# Patient Record
Sex: Female | Born: 1970 | Race: White | Hispanic: No | Marital: Married | Smoking: Never smoker
Health system: Southern US, Community
[De-identification: ages and names within clinical notes are randomized; demographics above are authoritative.]

## PROBLEM LIST (undated history)

## (undated) DIAGNOSIS — Z8659 Personal history of other mental and behavioral disorders: Secondary | ICD-10-CM

## (undated) DIAGNOSIS — N6019 Diffuse cystic mastopathy of unspecified breast: Secondary | ICD-10-CM

## (undated) HISTORY — PX: NO PAST SURGERIES: SHX2092

## (undated) HISTORY — DX: Diffuse cystic mastopathy of unspecified breast: N60.19

## (undated) HISTORY — DX: Personal history of other mental and behavioral disorders: Z86.59

---

## 2005-06-04 ENCOUNTER — Ambulatory Visit (HOSPITAL_COMMUNITY): Admission: RE | Admit: 2005-06-04 | Discharge: 2005-06-04 | Payer: Self-pay | Admitting: Gynecology

## 2005-06-04 ENCOUNTER — Encounter (INDEPENDENT_AMBULATORY_CARE_PROVIDER_SITE_OTHER): Payer: Self-pay | Admitting: Specialist

## 2006-03-18 ENCOUNTER — Ambulatory Visit: Payer: Self-pay | Admitting: Family Medicine

## 2006-03-27 ENCOUNTER — Encounter: Admission: RE | Admit: 2006-03-27 | Discharge: 2006-03-27 | Payer: Self-pay | Admitting: Family Medicine

## 2006-09-11 ENCOUNTER — Other Ambulatory Visit: Admission: RE | Admit: 2006-09-11 | Discharge: 2006-09-11 | Payer: Self-pay | Admitting: Gynecology

## 2006-11-21 ENCOUNTER — Ambulatory Visit: Payer: Self-pay | Admitting: Gynecology

## 2006-12-01 ENCOUNTER — Encounter (INDEPENDENT_AMBULATORY_CARE_PROVIDER_SITE_OTHER): Payer: Self-pay | Admitting: Gynecology

## 2006-12-01 ENCOUNTER — Ambulatory Visit: Payer: Self-pay | Admitting: Gynecology

## 2006-12-16 ENCOUNTER — Ambulatory Visit (HOSPITAL_COMMUNITY): Admission: RE | Admit: 2006-12-16 | Discharge: 2006-12-16 | Payer: Self-pay | Admitting: Gynecology

## 2006-12-29 ENCOUNTER — Ambulatory Visit: Payer: Self-pay | Admitting: Gynecology

## 2007-01-13 ENCOUNTER — Ambulatory Visit (HOSPITAL_COMMUNITY): Admission: RE | Admit: 2007-01-13 | Discharge: 2007-01-13 | Payer: Self-pay | Admitting: Gynecology

## 2007-01-27 ENCOUNTER — Ambulatory Visit: Payer: Self-pay | Admitting: Family Medicine

## 2007-01-27 ENCOUNTER — Ambulatory Visit (HOSPITAL_COMMUNITY): Admission: RE | Admit: 2007-01-27 | Discharge: 2007-01-27 | Payer: Self-pay | Admitting: Gynecology

## 2007-02-26 ENCOUNTER — Ambulatory Visit: Payer: Self-pay | Admitting: Obstetrics & Gynecology

## 2007-03-25 ENCOUNTER — Ambulatory Visit: Payer: Self-pay | Admitting: Obstetrics & Gynecology

## 2007-04-13 ENCOUNTER — Ambulatory Visit: Payer: Self-pay | Admitting: Gynecology

## 2007-04-27 ENCOUNTER — Ambulatory Visit: Payer: Self-pay | Admitting: Gynecology

## 2007-05-11 ENCOUNTER — Ambulatory Visit: Payer: Self-pay | Admitting: Gynecology

## 2007-05-25 ENCOUNTER — Ambulatory Visit: Payer: Self-pay | Admitting: Gynecology

## 2007-06-01 ENCOUNTER — Ambulatory Visit: Payer: Self-pay | Admitting: Gynecology

## 2007-06-08 ENCOUNTER — Ambulatory Visit: Payer: Self-pay | Admitting: Gynecology

## 2007-06-16 ENCOUNTER — Ambulatory Visit: Payer: Self-pay | Admitting: Family Medicine

## 2007-06-22 ENCOUNTER — Ambulatory Visit: Payer: Self-pay | Admitting: Obstetrics & Gynecology

## 2007-06-25 ENCOUNTER — Inpatient Hospital Stay (HOSPITAL_COMMUNITY): Admission: AD | Admit: 2007-06-25 | Discharge: 2007-06-27 | Payer: Self-pay | Admitting: Gynecology

## 2007-06-25 ENCOUNTER — Ambulatory Visit: Payer: Self-pay | Admitting: Physician Assistant

## 2007-07-08 ENCOUNTER — Ambulatory Visit: Payer: Self-pay | Admitting: Gynecology

## 2007-08-10 ENCOUNTER — Ambulatory Visit: Payer: Self-pay | Admitting: Gynecology

## 2007-08-19 ENCOUNTER — Ambulatory Visit: Payer: Self-pay | Admitting: Family Medicine

## 2007-11-24 ENCOUNTER — Ambulatory Visit: Payer: Self-pay | Admitting: Family Medicine

## 2007-11-24 ENCOUNTER — Encounter: Payer: Self-pay | Admitting: Family Medicine

## 2008-02-24 ENCOUNTER — Ambulatory Visit: Payer: Self-pay | Admitting: Family Medicine

## 2008-09-27 ENCOUNTER — Ambulatory Visit: Payer: Self-pay | Admitting: Nurse Practitioner

## 2008-11-14 LAB — CONVERTED CEMR LAB: Pap Smear: NORMAL

## 2008-11-17 ENCOUNTER — Encounter: Payer: Self-pay | Admitting: Family Medicine

## 2008-11-17 ENCOUNTER — Ambulatory Visit: Payer: Self-pay | Admitting: Obstetrics & Gynecology

## 2008-12-08 ENCOUNTER — Encounter: Payer: Self-pay | Admitting: Family Medicine

## 2008-12-08 ENCOUNTER — Ambulatory Visit: Payer: Self-pay | Admitting: Family Medicine

## 2009-03-31 ENCOUNTER — Ambulatory Visit: Payer: Self-pay | Admitting: Family Medicine

## 2009-03-31 DIAGNOSIS — M545 Low back pain: Secondary | ICD-10-CM

## 2009-03-31 DIAGNOSIS — J309 Allergic rhinitis, unspecified: Secondary | ICD-10-CM | POA: Insufficient documentation

## 2009-03-31 DIAGNOSIS — Z87898 Personal history of other specified conditions: Secondary | ICD-10-CM | POA: Insufficient documentation

## 2009-03-31 DIAGNOSIS — G43109 Migraine with aura, not intractable, without status migrainosus: Secondary | ICD-10-CM

## 2009-03-31 DIAGNOSIS — E039 Hypothyroidism, unspecified: Secondary | ICD-10-CM

## 2009-03-31 DIAGNOSIS — Z8659 Personal history of other mental and behavioral disorders: Secondary | ICD-10-CM

## 2009-04-07 ENCOUNTER — Ambulatory Visit: Payer: Self-pay | Admitting: Family Medicine

## 2009-04-12 LAB — CONVERTED CEMR LAB
ALT: 25 units/L (ref 0–35)
AST: 28 units/L (ref 0–37)
Albumin: 3.9 g/dL (ref 3.5–5.2)
BUN: 16 mg/dL (ref 6–23)
Calcium: 9 mg/dL (ref 8.4–10.5)
Creatinine, Ser: 0.7 mg/dL (ref 0.4–1.2)
Glucose, Bld: 83 mg/dL (ref 70–99)
Potassium: 4.1 meq/L (ref 3.5–5.1)
Total CHOL/HDL Ratio: 3
Total Protein: 6.8 g/dL (ref 6.0–8.3)
Triglycerides: 42 mg/dL (ref 0.0–149.0)
VLDL: 8.4 mg/dL (ref 0.0–40.0)

## 2009-05-10 ENCOUNTER — Ambulatory Visit: Payer: Self-pay | Admitting: Obstetrics & Gynecology

## 2009-07-06 ENCOUNTER — Encounter: Payer: Self-pay | Admitting: Family Medicine

## 2009-09-28 IMAGING — US ULTRASOUND LEFT BREAST
1 series · 14 of 14 positions shown · non-contrast
Comparison: none

REASON FOR EXAM: left breast nodule
COMMENTS:

[Series 1: ultrasound left breast · 14 of 14 slices shown]
[im 1/14]
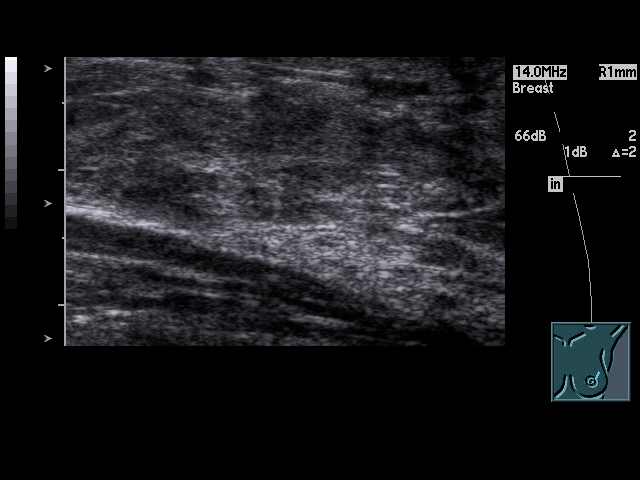
[im 2/14]
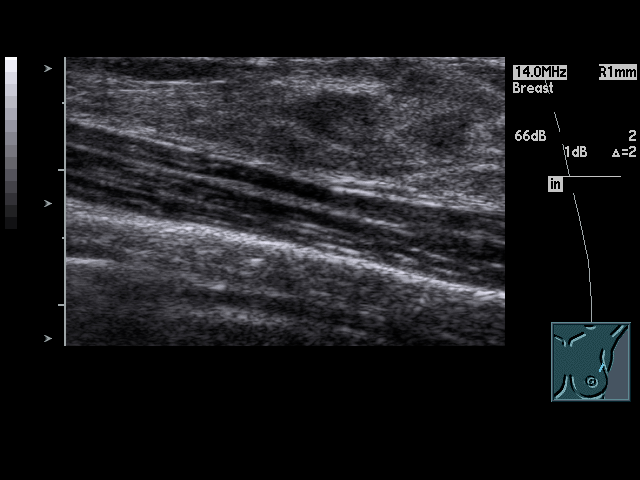
[im 3/14]
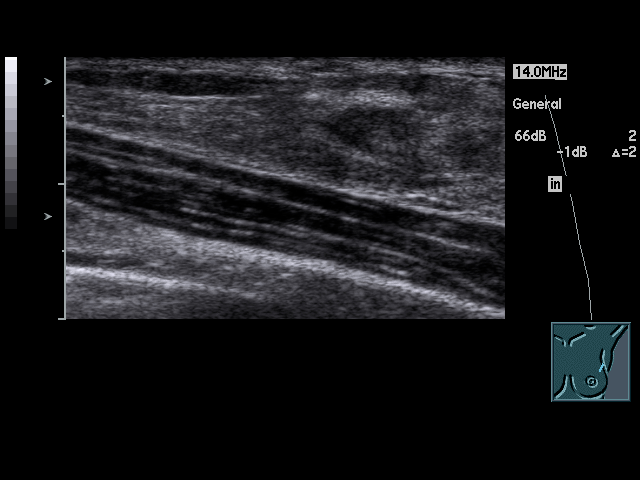
[im 4/14]
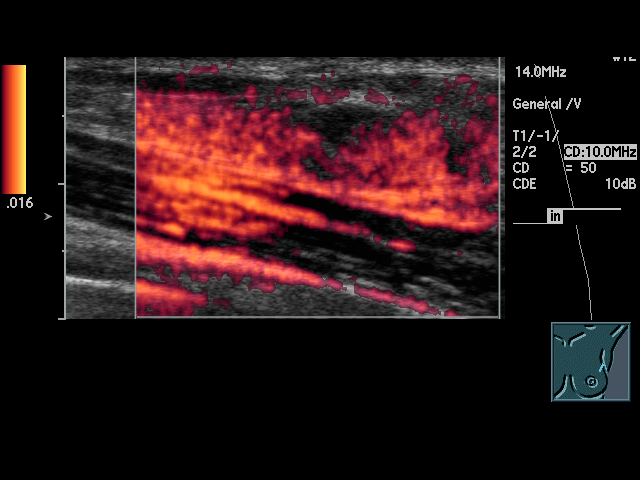
[im 5/14]
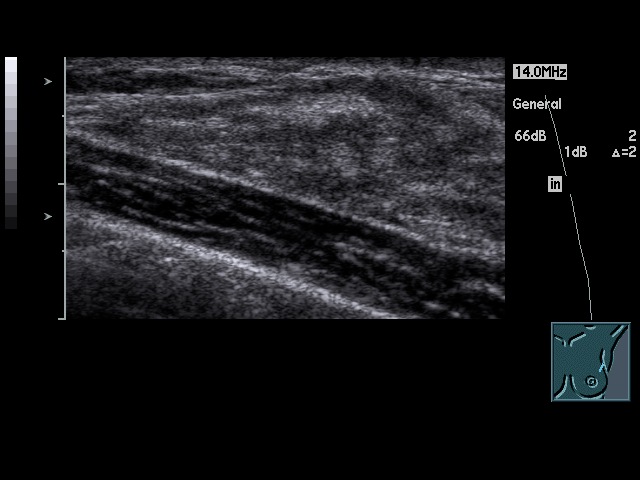
[im 6/14]
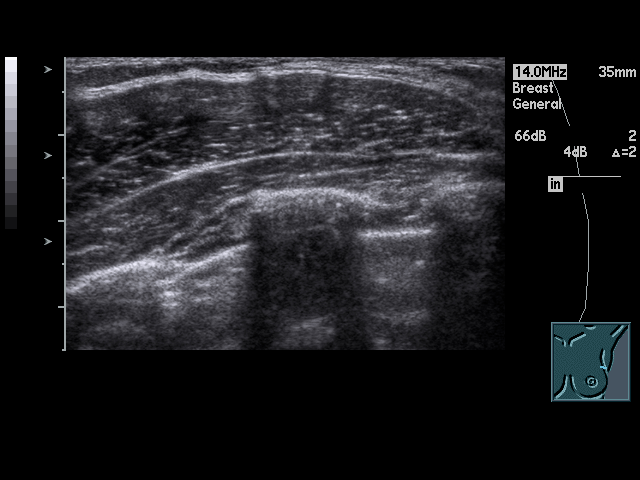
[im 7/14]
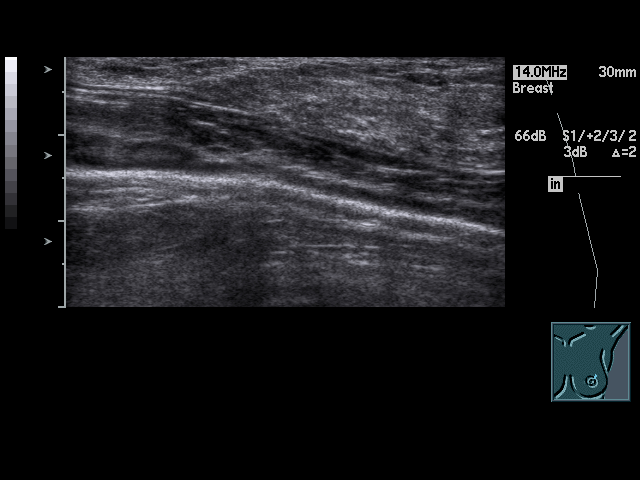
[im 8/14]
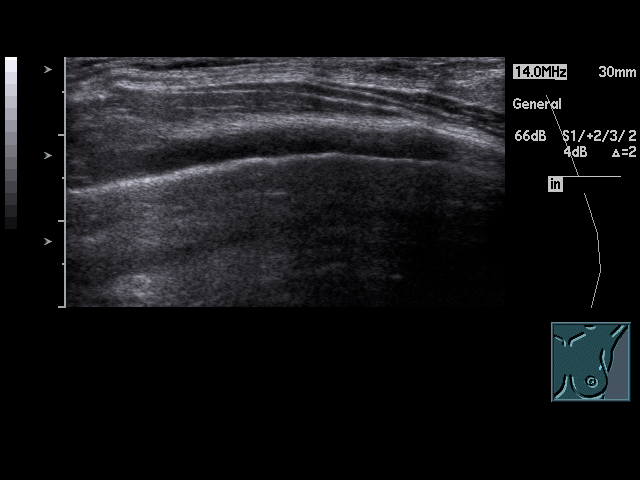
[im 9/14]
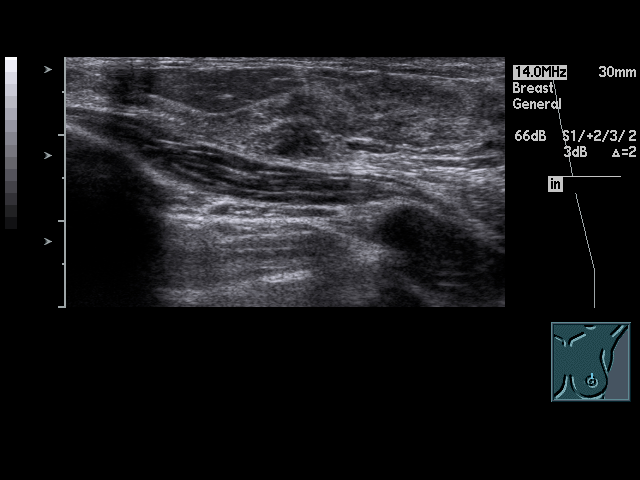
[im 10/14]
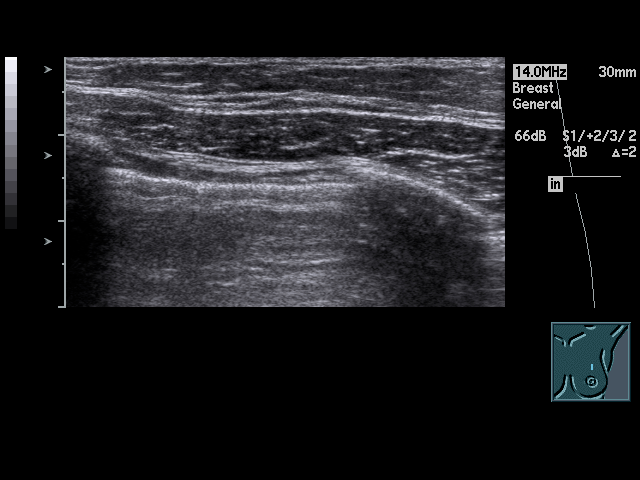
[im 11/14]
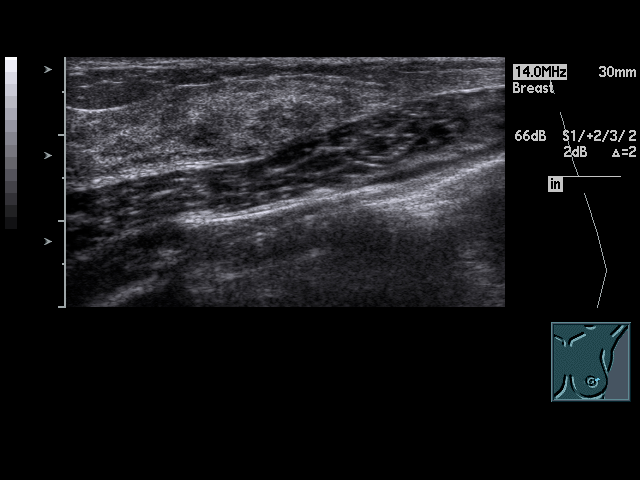
[im 12/14]
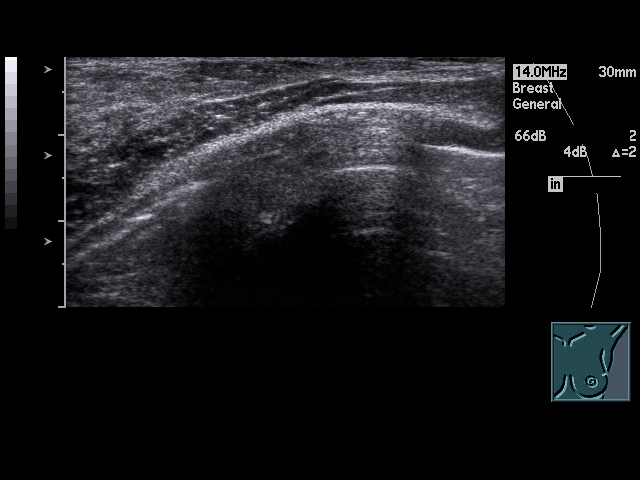
[im 13/14]
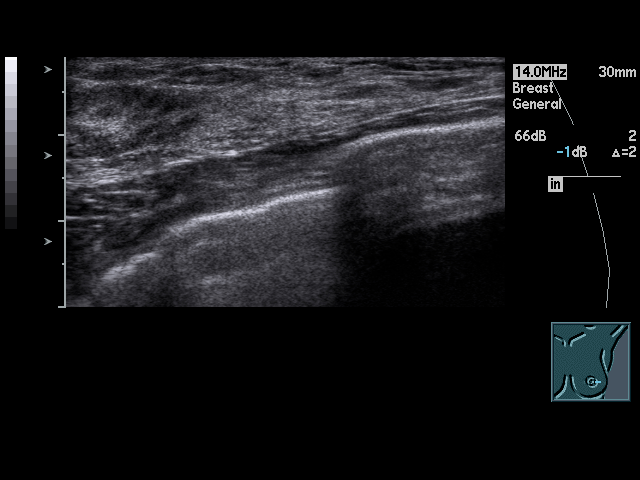
[im 14/14]
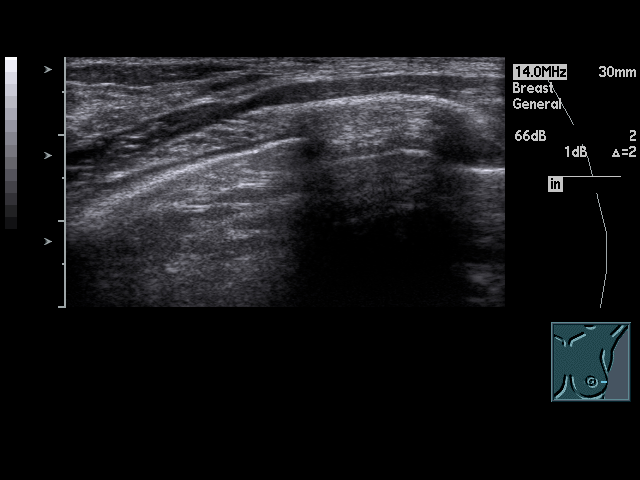

[14 of 14 positions shown; findings below may reference images not displayed]

PROCEDURE:     US  - US BREAST LEFT  - May 10, 2009  [DATE]

RESULT:

No dominant masses or pathologic clustered calcifications are demonstrated.
In the region of questionable palpable abnormality, no focal abnormality is
noted by mammography.

Ultrasound was performed and reveals no focal abnormality.

If a palpable abnormality persist, surgical consultation would be suggested
as a negative mammogram and ultrasound does not preclude biopsy.
IMPRESSION: 1.      Negative mammogram and ultrasound.
2.      A negative mammogram and ultrasound does not preclude biopsy in the
presence of a persistent palpable nodule.

## 2009-12-14 ENCOUNTER — Ambulatory Visit: Payer: Self-pay | Admitting: Family Medicine

## 2010-03-15 ENCOUNTER — Ambulatory Visit: Payer: Self-pay | Admitting: Family Medicine

## 2010-03-15 LAB — CONVERTED CEMR LAB
Antibody Screen: NEGATIVE
Eosinophils Absolute: 0.1 10*3/uL (ref 0.0–0.7)
HCT: 36.5 % (ref 36.0–46.0)
Hemoglobin: 12.2 g/dL (ref 12.0–15.0)
Lymphs Abs: 2.6 10*3/uL (ref 0.7–4.0)
MCHC: 33.4 g/dL (ref 30.0–36.0)
Monocytes Absolute: 0.6 10*3/uL (ref 0.1–1.0)
Monocytes Relative: 7 % (ref 3–12)
Neutro Abs: 5.1 10*3/uL (ref 1.7–7.7)
Neutrophils Relative %: 61 % (ref 43–77)
RDW: 14.1 % (ref 11.5–15.5)
Rh Type: POSITIVE
hCG, Beta Chain, Quant, S: 2988.2 milliintl units/mL

## 2010-03-21 ENCOUNTER — Ambulatory Visit: Payer: Self-pay | Admitting: Family Medicine

## 2010-04-02 ENCOUNTER — Ambulatory Visit (HOSPITAL_COMMUNITY): Admission: RE | Admit: 2010-04-02 | Discharge: 2010-04-02 | Payer: Self-pay | Admitting: Obstetrics & Gynecology

## 2010-04-17 ENCOUNTER — Ambulatory Visit: Payer: Self-pay | Admitting: Family Medicine

## 2010-04-26 ENCOUNTER — Encounter: Payer: Self-pay | Admitting: Family Medicine

## 2010-04-26 ENCOUNTER — Ambulatory Visit (HOSPITAL_COMMUNITY): Admission: RE | Admit: 2010-04-26 | Discharge: 2010-04-26 | Payer: Self-pay | Admitting: Family Medicine

## 2010-05-15 ENCOUNTER — Ambulatory Visit: Payer: Self-pay | Admitting: Family Medicine

## 2010-05-16 ENCOUNTER — Encounter: Payer: Self-pay | Admitting: Family Medicine

## 2010-05-19 ENCOUNTER — Emergency Department: Payer: Self-pay | Admitting: Unknown Physician Specialty

## 2010-05-21 ENCOUNTER — Ambulatory Visit: Payer: Self-pay | Admitting: Obstetrics and Gynecology

## 2010-05-29 ENCOUNTER — Ambulatory Visit (HOSPITAL_COMMUNITY): Admission: RE | Admit: 2010-05-29 | Discharge: 2010-05-29 | Payer: Self-pay | Admitting: Family Medicine

## 2010-05-31 ENCOUNTER — Ambulatory Visit: Payer: Self-pay | Admitting: Obstetrics & Gynecology

## 2010-06-01 ENCOUNTER — Encounter: Payer: Self-pay | Admitting: Family Medicine

## 2010-06-01 ENCOUNTER — Ambulatory Visit (HOSPITAL_COMMUNITY): Admission: RE | Admit: 2010-06-01 | Discharge: 2010-06-01 | Payer: Self-pay | Admitting: Family Medicine

## 2010-06-02 ENCOUNTER — Encounter: Payer: Self-pay | Admitting: Obstetrics & Gynecology

## 2010-06-12 ENCOUNTER — Ambulatory Visit: Payer: Self-pay | Admitting: Family Medicine

## 2010-06-12 LAB — CONVERTED CEMR LAB
ALT: 12 units/L (ref 0–35)
Albumin: 3.5 g/dL (ref 3.5–5.2)
BUN: 15 mg/dL (ref 6–23)
Calcium: 8.5 mg/dL (ref 8.4–10.5)
Chloride: 104 meq/L (ref 96–112)
Creatinine, Ser: 0.55 mg/dL (ref 0.40–1.20)
Glucose, Bld: 57 mg/dL — ABNORMAL LOW (ref 70–99)
TSH: 2.17 microintl units/mL (ref 0.350–4.500)
Total Bilirubin: 0.4 mg/dL (ref 0.3–1.2)
Total Protein: 5.9 g/dL — ABNORMAL LOW (ref 6.0–8.3)

## 2010-06-15 ENCOUNTER — Ambulatory Visit (HOSPITAL_COMMUNITY): Admission: RE | Admit: 2010-06-15 | Discharge: 2010-06-15 | Payer: Self-pay | Admitting: Family Medicine

## 2010-06-15 ENCOUNTER — Encounter: Payer: Self-pay | Admitting: Family Medicine

## 2010-07-06 ENCOUNTER — Encounter (INDEPENDENT_AMBULATORY_CARE_PROVIDER_SITE_OTHER): Payer: Self-pay | Admitting: *Deleted

## 2010-07-12 ENCOUNTER — Ambulatory Visit: Payer: Self-pay | Admitting: Obstetrics & Gynecology

## 2010-07-12 ENCOUNTER — Encounter: Payer: Self-pay | Admitting: Family Medicine

## 2010-07-12 LAB — CONVERTED CEMR LAB
Chlamydia, Swab/Urine, PCR: NEGATIVE
GC Probe Amp, Urine: NEGATIVE

## 2010-10-20 IMAGING — US US RENAL
1 series · 14 of 25 positions shown · non-contrast
Comparison: None.

CLINICAL DATA: 16 weeks estimated gestational age.  Rule out kidney
stones.  Left flank pain

RENAL/URINARY TRACT ULTRASOUND COMPLETE

[Series 1: us renal · 0.23mm/px · 14 of 33 slices shown]
[im 1/33]
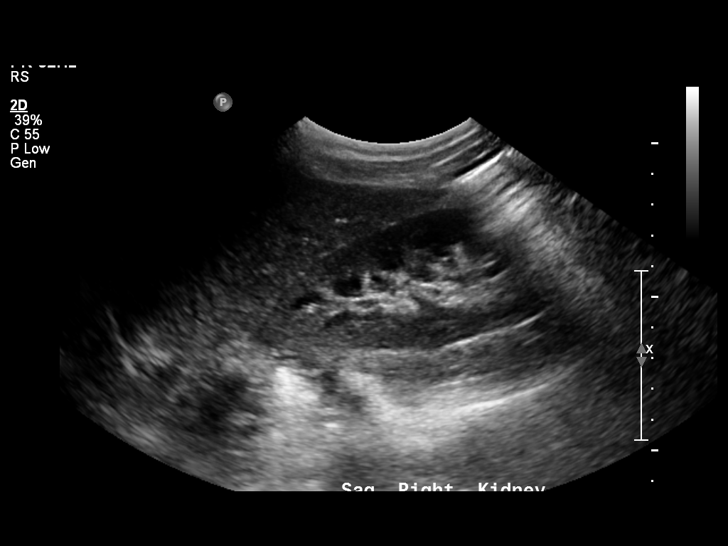
[im 3/33]
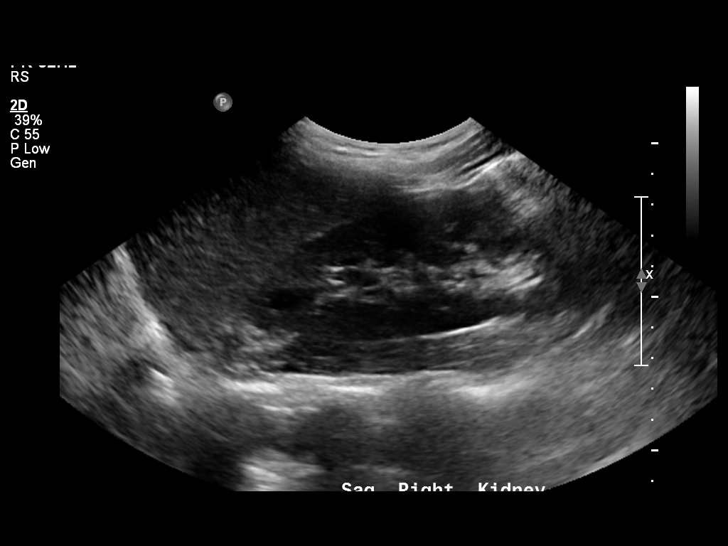
[im 6/33]
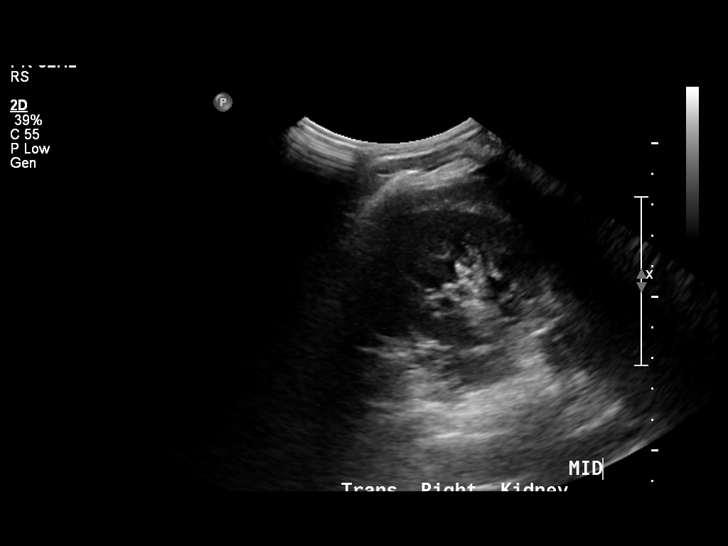
[im 9/33]
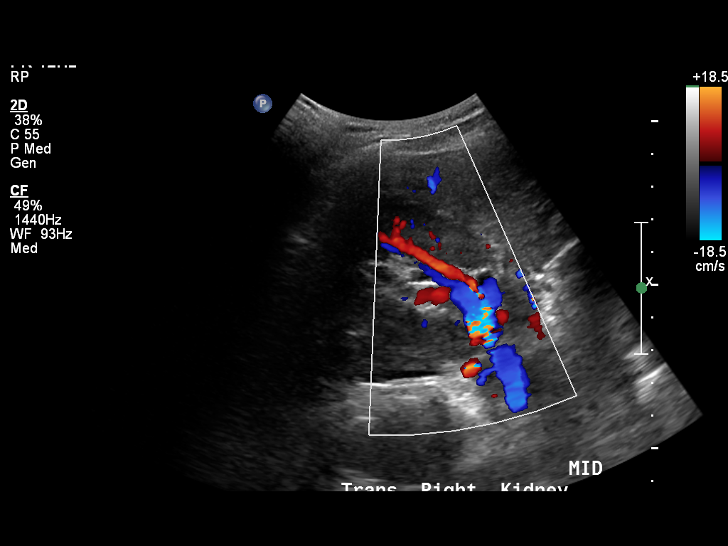
[im 11/33]
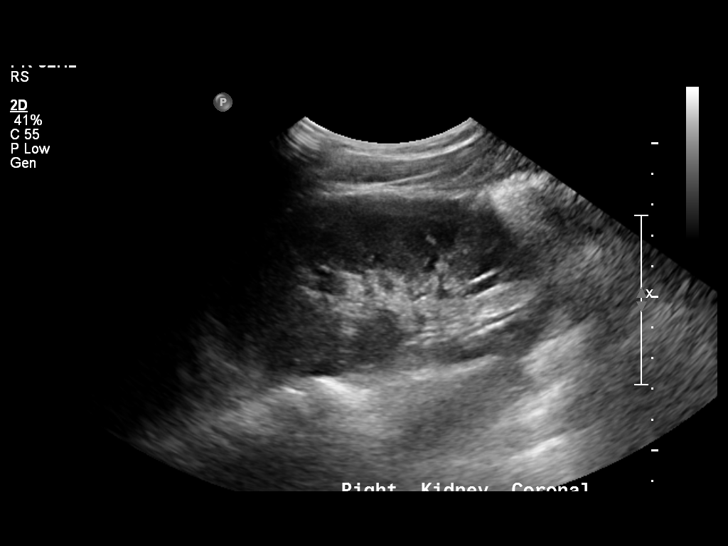
[im 13/33]
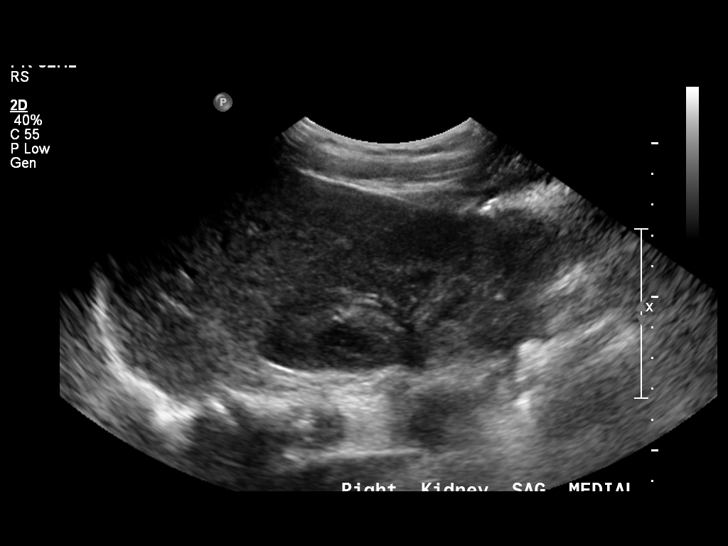
[im 15/33]
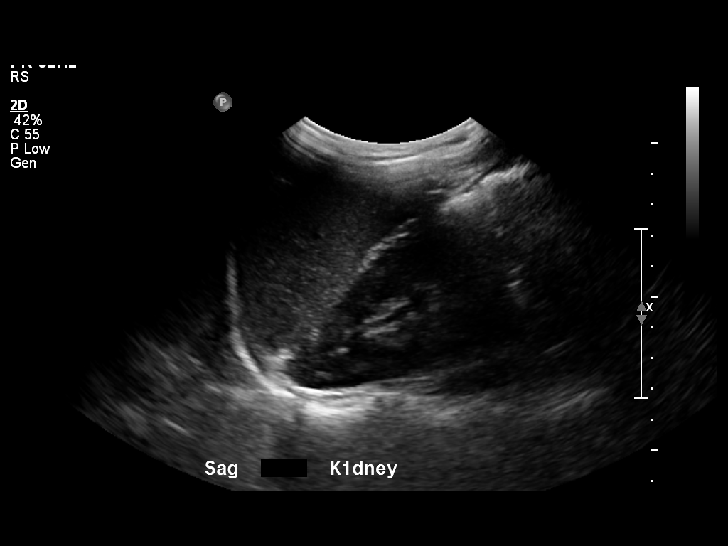
[im 18/33]
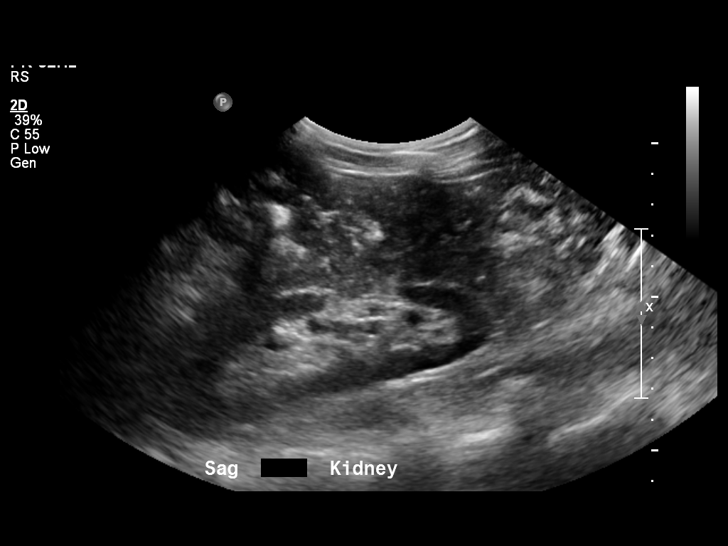
[im 21/33]
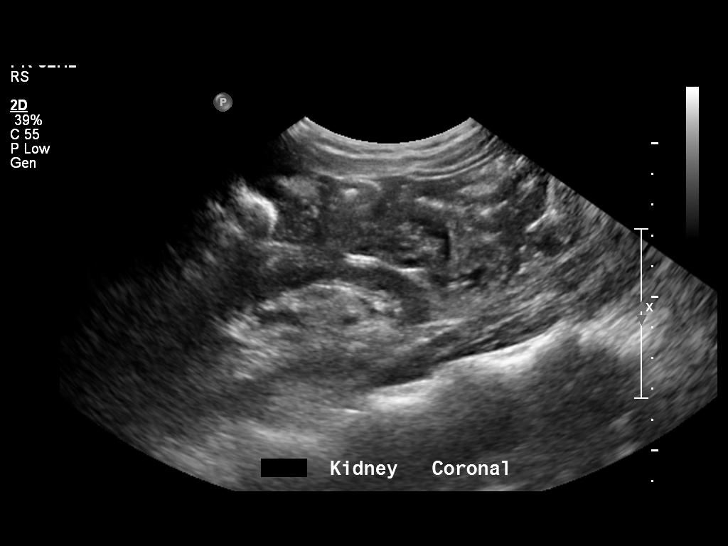
[im 22/33]
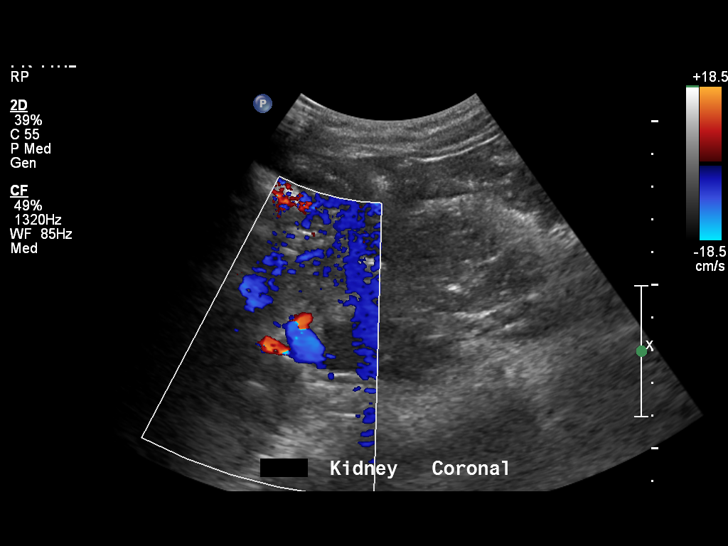
[im 25/33]
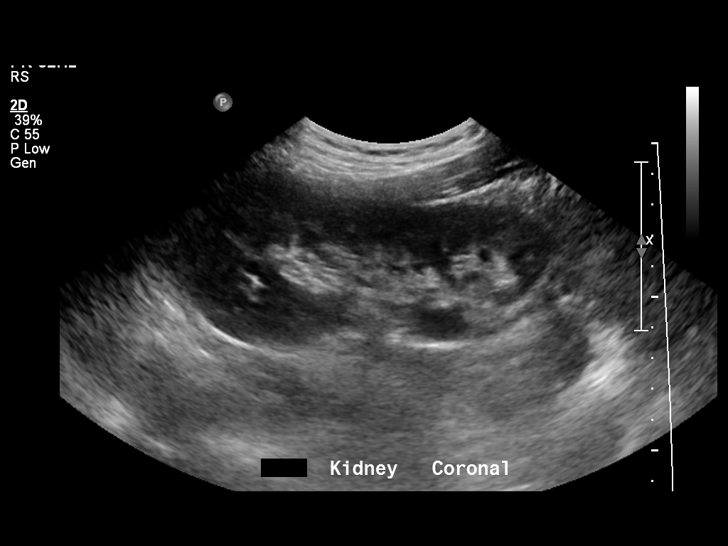
[im 27/33]
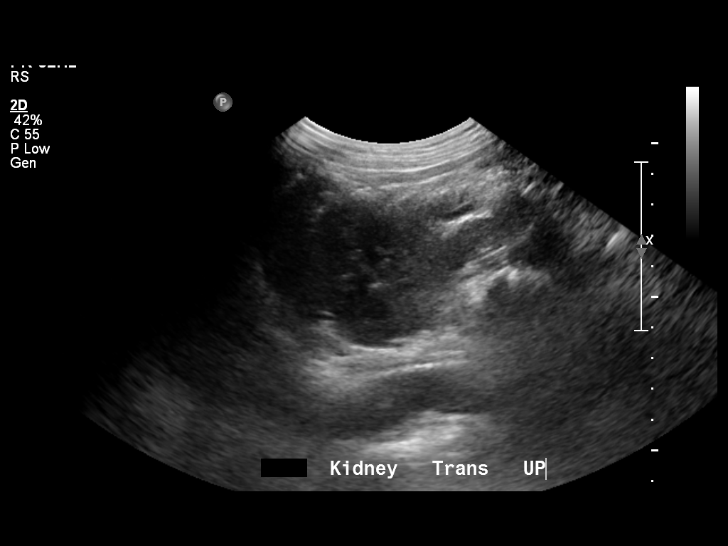
[im 30/33]
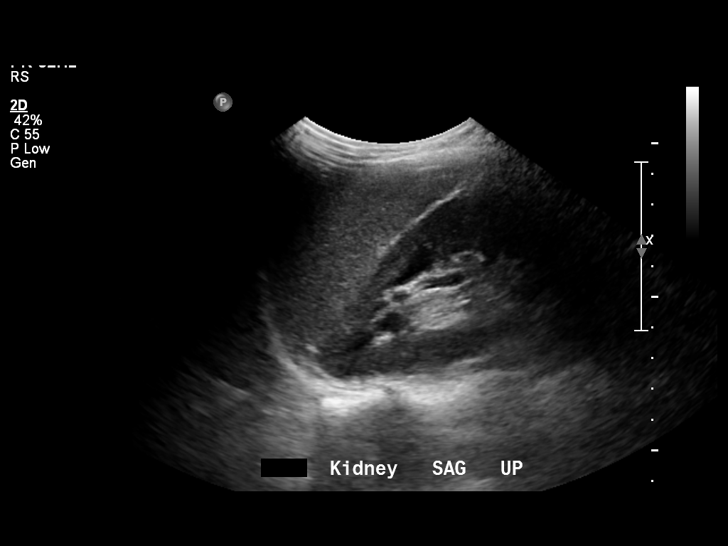
[im 33/33]
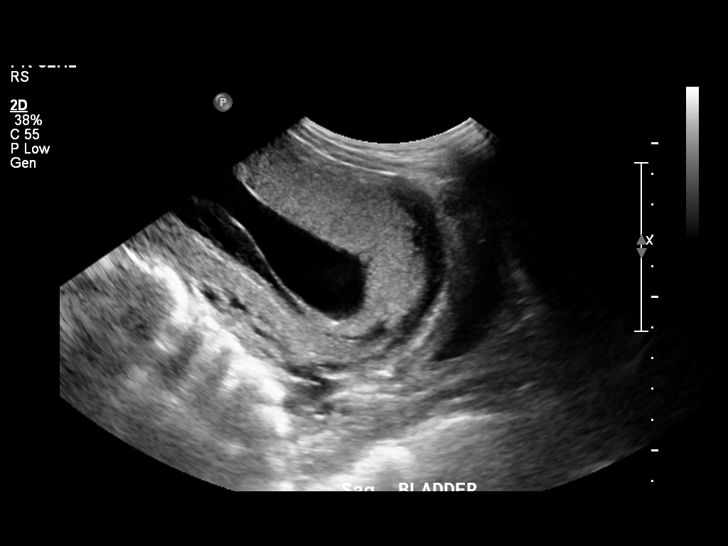

[14 of 25 positions shown; findings below may reference images not displayed]

FINDINGS: Right Kidney:  The right kidney demonstrates a sagittal length of
10.2 cm.  No focal parenchymal abnormalities or signs of
hydronephrosis are evident.  No proximal ureterectasis is noted

Left Kidney:  Demonstrates a sagittal length of 10.4 cm.  No focal
parenchymal abnormalities or signs of hydronephrosis are noted.  No
proximal ureterectasis is seen

Bladder:  Was incompletely filled.  Assessment for ureteral jets
was not possible.
IMPRESSION: Unremarkable appearance to the kidneys.  Unfilled bladder precludes
evaluation for ureteral jets

## 2010-11-13 NOTE — Miscellaneous (Signed)
Summary: flu shot at [facility]  Clinical Lists Changes  Observations: Added new observation of FLU VAX: Historical (07/05/2010 9:01)      Immunization History:  Influenza Immunization History:    Influenza:  historical (07/05/2010)  Pt received flu vaccine at [facility] [address], [place].              Lowella Petties CMA  July 06, 2010 9:01 AM

## 2011-02-26 NOTE — Assessment & Plan Note (Signed)
NAME:  COLE, KLUGH NO.:  [id number]   MEDICAL RECORD NO.:  [id number]          PATIENT TYPE:  POB   LOCATION:  [facility] at [facility]         FACILITY:  [facility]   PHYSICIAN:  Argentina Donovan, MD        DATE OF BIRTH:  [birth year]   DATE OF SERVICE:  09/27/2008                                  CLINIC NOTE   The patient comes to the office today for a consultation on her migraine  headaches.  The patient has been having migraines since she was in the  fifth grade.  She does describe migraine clearly needing all the  criteria.  She also is describing a visual aura.  She states that her  headaches are located in her bilateral temples.  She can have  approximately 1 severe per month, 1-2 moderates per month, 1-2 mild per  month.  In the past, she has taken ibuprofen, Maxalt, and Midrin.  She  has never been on headache preventative.  She does have hypothyroidism.  She does feel that she is in good place with her Synthroid and is in  good control.  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  She currently feels that her triggers  are certain foods, stress, hormones, and occasionally she has some  issues with sleep.  The patient is currently on birth control pills,  Garnette Scheuermann as well as Flintstones vitamins.   ALLERGIES:  No known allergies.   PHYSICAL EXAMINATION:  GENERAL:  Well-developed, well-nourished 40-year-  old Caucasian female in no acute distress.  VITAL SIGNS:  Blood pressure is 100/70, height is 5 feet 3 inches.  HEENT:  Head is normocephalic and atraumatic.  Pupils equal and reactive  to light.  CARDIAC:  Regular rate and rhythm.  LUNGS:  No murmurs, rubs, or thrills.  Lungs clear bilaterally without  rales, rhonchi, or wheezes.  NEUROLOGIC:  The patient is alert, oriented.  She is not anxious.  She  has fluid thoughts and movement and muscle coordination and sensation.   ASSESSMENT:  A migraine with aura.    PLAN:  1. The patient is asked to discontinue birth control pills secondary      to migraine with aura and her age.  2. The patient is considering pregnancy and is encouraged to continue      taking Flintstones vitamins in lieu of prenatal vitamins.  3. The patient is given a prescription for Maxalt, Midrin, and      ibuprofen to take if she is clearly not pregnant.  If she is      questioning whether she may be pregnant or not, she is given a      prescription for Phenergan and/or Darvocet to use as needed.  The      patient is overall in very good control with very infrequent      headaches, and I      do believe she can manage these on her own.  She will return to the      office in 1 year from a headache perspective or sooner if need be.  Remonia Richter, NP    ______________________________  Argentina Donovan, MD    LR/MEDQ  D:  09/27/2008  T:  09/28/2008  Job:  [id number]

## 2011-02-26 NOTE — Assessment & Plan Note (Signed)
NAME:  Joann Wilson, Joann Wilson NO.:  [id number]   MEDICAL RECORD NO.:  [id number]          PATIENT TYPE:  POB   LOCATION:  [facility] at [facility]         FACILITY:  [facility]   PHYSICIAN:  Tinnie Gens, MD        DATE OF BIRTH:  1971/05/09   DATE OF SERVICE:  11/24/2007                                  CLINIC NOTE   CHIEF COMPLAINT:  Yearly exam.   HISTORY OF PRESENT ILLNESS:  The patient is a 40 year old gravida 2,  para 1-0-1-1, who is here for a yearly exam.  She was last seen for a  postpartum check in October 2008 and is doing well.  She is staying home  from work still with her infant.  Is not really depressed but finds a  different level of stress now that she is a full-time mom.  The patient  is on Garnette Scheuermann and that seems to be working well for her.  She has not had  her thyroid checked since November.   PAST MEDICAL HISTORY:  Significant for hypothyroidism.   SOCIAL HISTORY:  Negative.   MEDICATIONS:  She is on Kariva OCPs and multivitamin.   ALLERGIES:  None known.   OBSTETRICAL HISTORY:  She is a G1, P1, with one vaginal delivery of a 6  pound 7 ounce baby.  One miscarriage.   GYN HISTORY:  No history of abnormal Paps.  Cycles are regular on the  pill.   FAMILY HISTORY:  Significant for diabetes and thyroid disorders and  cancer.   SOCIAL HISTORY:  No tobacco, alcohol or drug use.  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   EXAM:  Vitals are as noted in the chart.  She is a well-developed, well-nourished female in no acute distress.  ABDOMEN:  Soft, nontender, nondistended.  NECK:  Supple with normal thyroid.  LUNGS:  Clear bilaterally.  CARDIOVASCULAR:  Regular rate and rhythm.  No rubs, gallops, murmurs.  EXTREMITIES:  No cyanosis, clubbing or edema, 2+ distal pulses.  BREASTS:  Symmetrical with everted nipples.  She has some fibrocystic  change on the left outer quadrant but no supraclavicular or axillary  adenopathy and no distinct masses bilaterally.  GENITOURINARY:  Normal external female genitalia.  BUS is normal.  Vagina is pink and rugated.  Cervix is parous without lesions.  The  uterus was small, anteverted.  No adnexal mass or tenderness.   IMPRESSION:  1. Yearly exam with Papanicolaou smear.  2. Hypothyroidism.   PLAN:  1. Pap smear.  2. Check a TSH.           ______________________________  Tinnie Gens, MD     TP/MEDQ  D:  11/24/2007  T:  11/26/2007  Job:  [id number]

## 2011-02-26 NOTE — Assessment & Plan Note (Signed)
NAME:  Joann Wilson, STARN NO.:  [id number]   MEDICAL RECORD NO.:  [id number]          PATIENT TYPE:  POB   LOCATION:  [facility] at [facility]         FACILITY:  [facility]   PHYSICIAN:  Tinnie Gens, MD        DATE OF BIRTH:  11-May-1971   DATE OF SERVICE:  12/14/2009                                  CLINIC NOTE   CHIEF COMPLAINT:  Physical exam.   HISTORY OF PRESENT ILLNESS:  The patient is a 40 year old, gravida 2,  para 1-0-1-1, who has a history of migraine headaches and  hypothyroidism.  She had TSH checked by her primary doctor in spring.  She has ran off the pill because of migraines with aura for the past  year and trying to achieve pregnancy initially.  She had some irregular  cycles and they became regular every 28 days, now they have been  increasingly irregular.  She has one somewhere between 3 and 6 weeks.  She is not sure if she is having intercourse for the exact right time to  achieve pregnancy.  However, she is going full term this year.  Additionally, she complains of some abnormal mood, feeling sometimes  grothy and that may or may not be related to hormonal changes during her  cycle.   PAST MEDICAL HISTORY:  Hypothyroidism, migraine headaches.   PAST SURGICAL HISTORY:  Negative.   MEDICATIONS:  1. She is on Synthroid 125 mcg p.o. daily.  2. Multivitamin 1 p.o. daily.  3. Maxalt 1 p.o. daily p.r.n.  4. Midrin 1 p.o. p.r.n.   ALLERGIES:  None known.   OBSTETRICAL HISTORY:  G2, P1, 1 vaginal delivery, 1 SAB.   GYNECOLOGIC HISTORY:  No history of abnormal Pap, cycles every 28-40  days.   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  No tobacco, alcohol, or drug  use.   FAMILY HISTORY:  Significant for diabetes, thyroid disorder, prostate  cancer, coronary artery disease.   REVIEW OF SYSTEMS:  She denies headache, vision changes aside from  migraine headaches occasionally.  She denies chest pain, shortness  breath, nausea, vomiting, diarrhea,  constipation, abdominal pain,  dysuria, vaginal discharge, fevers, chills.  She does have fibrocystic  change in her breast which is tender at time.  She does have mammogram  done last year and needs another one this year.   PHYSICAL EXAMINATION:  VITAL SIGNS:  Blood pressure is 126/71, weight is  108, pulse is 73.  GENERAL:  She is a well developed, well nourished female, in no acute  distress.  HEENT:  Normocephalic, atraumatic.  Sclerae anicteric.  NECK:  Supple.  Normal thyroid.  LUNGS:  Clear bilaterally.  CV:  Regular rate and rhythm.  No rubs, gallops, or murmurs.  ABDOMEN:  Soft, nontender, nondistended.  GU:  Normal external female genitalia.  BUS is normal.  Vagina is pink  and rugated.  Cervix is parous without lesions.  Uterus is small,  anteverted.  No adnexal mass or tenderness.  BREASTS:  Symmetric with everted nipples.  No masses.  No subclavicular  or axillary adenopathy.   IMPRESSION:  1. Gynecologic exam.  2. Hypothyroidism.  3. Migraine  headaches.  4. Irregular cycles.   PLAN:  1. Check TSH.  2. Follow up in 2 or 3 weeks.  If her cycles are not the issue, may      need further pursuing of either birth control or fertility help.           ______________________________  Tinnie Gens, MD     TP/MEDQ  D:  12/14/2009  T:  12/15/2009  Job:  [id number]

## 2011-02-26 NOTE — Assessment & Plan Note (Signed)
NAMECORRA, KAINE NO.:  [id number]   MEDICAL RECORD NO.:  [id number]          PATIENT TYPE:  POB   LOCATION:  [facility]         FACILITY:  [facility]   PHYSICIAN:  Tinnie Gens, MD        DATE OF BIRTH:  1970/12/25   DATE OF SERVICE:  08/10/2007                                  CLINIC NOTE   CHIEF COMPLAINT:  Postpartum check.   HISTORY OF PRESENT ILLNESS:  The patient is a 40 year old gravida 2,  para 1-0-1-1 who is status post vaginal delivery at term of a viable  female infant 6 pounds 7 ounces.  Patient has breast fed for  approximately 5 weeks but has now pretty much stopped that.  The patient  has no real issues with her mood as yet.  She has decided to stay home  with her child which is a new change for her.  She has not had a cycle  yet.  Her last Pap was 11/2006 and was normal.  The patient's last TSH  was checked in September and was 0.402.  Patient is __________  getting  pregnant right away but would like something for birth control.   PHYSICAL EXAMINATION:  VITAL SIGNS:  On exam, her vitals are as on the  chart.  GENERAL:  She is a well-developed, well-nourished female in no acute  stress.  LUNGS:  Clear bilaterally.  CV:  Regular rate and rhythm with no rubs, gallops, or murmurs.  ABDOMEN:  Soft, nontender, nondistended.  GU:  Normal external female genitalia.  Vagina is pink and rugated.  Uterus is small, anteverted.  No adnexal mass or tenderness.   IMPRESSION:  Postpartum check; doing well.   PLAN:  1. Bottle feeding  2. Ortho-Novum for birth control.  She may start after her next cycle.           ______________________________  Tinnie Gens, MD     TP/MEDQ  D:  08/10/2007  T:  08/11/2007  Job:  [id number]

## 2011-02-26 NOTE — Assessment & Plan Note (Signed)
NAME:  Joann Wilson, Joann Wilson NO.:  [id number]   MEDICAL RECORD NO.:  [id number]          PATIENT TYPE:  POB   LOCATION:  [facility]         FACILITY:  [facility]   PHYSICIAN:  Tinnie Gens, MD        DATE OF BIRTH:  Jan 14, 1971   DATE OF SERVICE:  12/08/2008                                  CLINIC NOTE   CHIEF COMPLAINT:  Physical exam.   HISTORY OF PRESENT ILLNESS:  The patient is a 40 year old gravida 2,  para 1-0-1-2.  She comes in today for yearly exam.  She has a history of  migraine headaches and hypothyroidism.  She last had her TSH checked on  November 17, 2008, that was normal.  The patient has had 40-day cycle.  She is attempting pregnancy right now.  She is on 2 multivitamins a day.  She is currently using Midrin and Maxalt for headaches, which seems to  be working well for her.  She has come off her birth control pills  because of migraine with aura and her age.   PAST MEDICAL HISTORY:  Hypothyroidism, migraine headaches.   PAST SURGICAL HISTORY:  Negative.   MEDICATIONS:  1. Multivitamin.  2. Synthroid 125 mcg daily.  3. Maxalt 1 p.o. daily p.r.n.  4. Midrin 1 p.o. p.r.n.   ALLERGIES:  None known.   OBSTETRICAL HISTORY:  G2, P1, 1 vaginal delivery, 1 miscarriage.   GYNECOLOGICAL HISTORY:  No history of abnormal Pap.  Cycles are every 28-  40 days.   SOCIAL HISTORY:  No tobacco, alcohol, or drug use.  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   FAMILY HISTORY:  Significant for diabetes, thyroid disorder, and cancer.   On review of systems, she denies headaches as that from what is in the  HPI, vision changes, chest pain, shortness of breath, nausea, vomiting,  diarrhea, constipation, abdominal pain, dysuria, vaginal discharge.  She  does note some breast tenderness and some fibrocystic change in her  breasts which are mildly tender at times.   PHYSICAL EXAMINATION:  VITAL SIGNS:  Weight is 108.5, blood pressure  120/71, pulse 72.  GENERAL:  She is a  thin white female in no acute distress.  HEENT:  Normocephalic, atraumatic.  Sclerae anicteric.  NECK:  Supple.  Normal thyroid.  LUNGS:  Clear bilaterally.  CV:  Regular rate and rhythm.  No murmurs, rubs, or gallops.  ABDOMEN:  Soft, nontender, nondistended.  EXTREMITIES:  No cyanosis, clubbing, or edema.  BREASTS:  Symmetric with everted nipples.  No masses.  She has got  fibrocystic change more prominent in the left breast in the upper outer  quadrant.  There is no supraclavicular or axillary adenopathy.  There is  no dominant masses.  GU:  Normal external female genitalia.  BUN is normal.  Vagina is pink  and rugated.  Cervix is parous without lesion.  Uterus is small,  anteverted.  No adnexal mass or tenderness.   IMPRESSION:  1. Physical exam today with Pap.  2. History of hypothyroidism, well controlled.  3. History of migraine headaches.  4. Lengthening cycles with plan for pregnancy soon.   PLAN:  1. Pap smear today.  2. Basal body temperature charting over the next few months.  If she      fails to ovulate, we would consider Clomid for her.  We would not      wait long to get her to [facility] if necessary given her age.  Continue      multivitamins in case she desire to pregnancy.           ______________________________  Tinnie Gens, MD     TP/MEDQ  D:  12/08/2008  T:  12/09/2008  Job:  [id number]

## 2011-03-01 NOTE — Group Therapy Note (Signed)
Joann Wilson, Joann Wilson NO.:  [id number]   MEDICAL RECORD NO.:  [id number]          PATIENT TYPE:  WOC   LOCATION:  [facility]                   FACILITY:  [facility]   PHYSICIAN:  Tinnie Gens, MD        DATE OF BIRTH:  January 18, 1971   DATE OF SERVICE:                                    CLINIC NOTE   Patient was seen at [facility].   CHIEF COMPLAINT:  Breast mass.   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 Mia Creek in the past, who comes in  today with a several week history of BB-sized lump in her left breast that  feels different than the surrounding tissue.  Initially, it was sort of  tender, but now it is not.  There is also no significant change through two  weeks of her menstrual cycle.   Patient has a history of fibrocystic changes in both breasts.  Has had a  mammogram at age 74 because of this and has kept up with her breast exams on  a regular basis, and this definitely felt different to her.  Patient has no  family history of breast cancer.   PHYSICAL EXAMINATION:  VITAL SIGNS:  As noted in the chart.  GENERAL:  She is a well-developed and well-nourished white female in no  acute distress.  ABDOMEN:  Soft, nontender, nondistended.  BREASTS:  Symmetric with everted nipples.  She has no supraclavicular or  axillary adenopathy.  She does have diffuse fibrocystic change in the outer  quadrants bilaterally and then on the left side, there is a less than 0.5 mm  x 0.75 cm of very firm but not very mobile tissue in the fibrocystic change  in the upper outer quadrant of the left breast.  It is sort of deep, and you  have to push in firmly to find it, but it definitely feels different than  the surrounding tissue.   IMPRESSION:  Breast mass.   PLAN:  We will send for imaging, mammogram, and ultrasound.  Following that,  the patient will need referral to general surgery for definitive  diagnosis.           ______________________________  Tinnie Gens, MD     TP/MEDQ  D:  03/18/2006  T:  03/19/2006  Job:  [id number]

## 2011-03-01 NOTE — Op Note (Signed)
NAMEFERNANDA, TWADDELL NO.:  [id number]   MEDICAL RECORD NO.:  [id number]          PATIENT TYPE:  AMB   LOCATION:  [facility]                           FACILITY:  [facility]   PHYSICIAN:  Ginger Carne, MD  DATE OF BIRTH:  Aug 04, 1971   DATE OF PROCEDURE:  06/04/2005  DATE OF DISCHARGE:                                 OPERATIVE REPORT   PREOPERATIVE DIAGNOSIS:  First trimester missed abortion.   POSTOPERATIVE DIAGNOSIS:  First trimester missed abortion.   OPERATION/PROCEDURE:  Aspiration, dilatation and curettage.   SURGEON:  Ginger Carne, M.D.   ASSISTANT:  None.   COMPLICATIONS:  None immediate.   ESTIMATED BLOOD LOSS:  Minimal.   ANESTHESIA:  MAC and local 0.5% Marcaine with epinephrine, paracervical  block.   OPERATIVE FINDINGS:  External genitalia, vulva and vagina normal.  Cervix  was without erosions or lesions.  Uterus was about six weeks in size with an  open cervix and the initiation of bleeding.  Transvaginal ultrasound  performed prior to surgery demonstrated a non-viable intrauterine pregnancy.  The patient is RH positive.   DESCRIPTION OF PROCEDURE:  The patient prepped and draped in the usual  fashion and placed in the lithotomy position.  Betadine solution was used to  antiseptic and the patient was catheterized prior to procedure.  After  adequate level of anesthesia, tenaculum was placed on the anterior lip of  the cervix.  Dilatation to accommodate #7 suction curet was followed by  sharp and then suction curettage.  Specimen to pathology.  Minimal bleeding  noted.  The patient returned to the post anesthesia recovery room in  excellent condition.  The patient received 2 g of cefoxitin preoperatively  IV.      Ginger Carne, MD  Electronically Signed     SHB/MEDQ  D:  06/04/2005  T:  06/05/2005  Job:  [id number]

## 2011-07-26 LAB — CBC
HCT: 39.2
MCV: 91.5
Platelets: 197
WBC: 15 — ABNORMAL HIGH

## 2012-08-19 ENCOUNTER — Ambulatory Visit (INDEPENDENT_AMBULATORY_CARE_PROVIDER_SITE_OTHER): Payer: Self-pay | Admitting: Family Medicine

## 2012-08-19 ENCOUNTER — Encounter: Payer: Self-pay | Admitting: Family Medicine

## 2012-08-19 VITALS — BP 95/59 | HR 94 | Ht 63.0 in | Wt 113.0 lb

## 2012-08-19 DIAGNOSIS — Z79890 Hormone replacement therapy (postmenopausal): Secondary | ICD-10-CM

## 2012-08-19 DIAGNOSIS — F411 Generalized anxiety disorder: Secondary | ICD-10-CM

## 2012-08-19 DIAGNOSIS — Z7189 Other specified counseling: Secondary | ICD-10-CM

## 2012-08-19 DIAGNOSIS — E039 Hypothyroidism, unspecified: Secondary | ICD-10-CM

## 2012-08-19 NOTE — Progress Notes (Signed)
Subjective:    Patient ID: Joann Wilson, female    DOB: 02-24-1971, 41 y.o.   MRN: [id number]  HPI After her last child she was going through a lot of anxiety in her life. She is being treated for this with benzodiazepines as well as an SSRI. Then she had an episode where she felt like she went completely numb on Zoloft. She really felt like he was the medication but instead add more medications and she really didn't get better. Eventually they adjust her thyroid hormone dose and this made a big difference. In addition she was started on testosterone and progesterone supplement after realizing that these hormone levels were low. She's been progressively getting much better. She did retry an SSRI last fall and says she had similar symptoms where she felt numb all over. Right now she's actually been weaning down on her alprazolam.  Hormones - She takes her progesterone up until her peroid starts and then holds if for 5 days.  Doing well on her current hormones.    Has tried zoloft and citalopram in the past and had numbness all over. She is currently turned wean down her Xanax. She was taking 0.5 times a day at one point in time. She is now trying to wean down to 0.25 once or twice a day. She says she's doing really well. She also thinks that moving back to [place] has been helpful as well. They previously lived in [place] for couple years which was very stressful for her especially with 2 children and now they're back in [place].   Review of Systems  Constitutional: Negative for fever, diaphoresis and unexpected weight change.  HENT: Negative for hearing loss, rhinorrhea and tinnitus.   Eyes: Negative for visual disturbance.  Respiratory: Negative for cough and wheezing.   Cardiovascular: Negative for chest pain and palpitations.  Gastrointestinal: Negative for nausea, vomiting, diarrhea and blood in stool.  Genitourinary: Negative for vaginal bleeding, vaginal discharge and difficulty  urinating.  Musculoskeletal: Negative for myalgias and arthralgias.  Skin: Negative for rash.  Neurological: Positive for headaches.  Hematological: Negative for adenopathy. Does not bruise/bleed easily.  Psychiatric/Behavioral: Positive for sleep disturbance. Negative for dysphoric mood. The patient is not nervous/anxious.        BP 95/59  Pulse 94  Ht 5\' 3"  (1.6 m)  Wt 113 lb (51.256 kg)  BMI 20.02 kg/m2    Allergies  Allergen Reactions  . Citalopram Other (See Comments)    Felt numb all over   . Zoloft (Sertraline Hcl) Other (See Comments)    Felt numb all over    Past Medical History  Diagnosis Date  . History of anxiety     Past Surgical History  Procedure Date  . No past surgeries     History   Social History  . Marital Status: Married    Spouse Name: Onalee Hua    Number of Children: 2  . Years of Education: college   Occupational History  . Homemaker    Social History Main Topics  . Smoking status: Never Smoker   . Smokeless tobacco: Not on file  . Alcohol Use: Yes     Comment: rarely  . Drug Use: No  . Sexually Active: Yes -- Female partner(s)    Birth Control/ Protection: Condom   Other Topics Concern  . Not on file   Social History Narrative   Does cardio workout 30-45 minutes 3 days per week. No caffeine intake.  Family History  Problem Relation Age of Onset  . Heart attack Maternal Grandmother 55  . Heart attack Father 43  . Hyperlipidemia Mother   . Hyperlipidemia Father   . Hypertension Mother   . Hypertension Father   . Diabetes Mother   . Diabetes Father     Outpatient Encounter Prescriptions as of 08/19/2012  Medication Sig Dispense Refill  . ALPRAZolam (XANAX) 0.5 MG tablet Take 0.5 mg by mouth 3 (three) times daily as needed.      . AMBULATORY NON FORMULARY MEDICATION Medication Name: testosterone 1mg /0.35ml.  Apply 0.3ml once daily      . levothyroxine (SYNTHROID, LEVOTHROID) 100 MCG tablet Take 100 mcg by mouth daily.      .  magnesium gluconate (MAGONATE) 500 MG tablet Take 250 mg by mouth daily.      . Misc Natural Products (PROGESTERONE EX) Apply 0.25 mLs topically daily. Strength is 50mg /0.69ml      . Multiple Vitamins-Minerals (MULTIVITAMIN WITH MINERALS) tablet Take 1 tablet by mouth daily.      Marland Kitchen omeprazole (PRILOSEC) 20 MG capsule Take 20 mg by mouth daily.      . [DISCONTINUED] fish oil-omega-3 fatty acids 1000 MG capsule Take 1 g by mouth daily.      . [DISCONTINUED] testosterone (ANDROGEL) 50 MG/5GM GEL Place 0.5 g onto the skin daily.           Objective:   Physical Exam  Constitutional: She appears well-developed and well-nourished.  HENT:  Head: Normocephalic and atraumatic.  Right Ear: External ear normal.  Left Ear: External ear normal.  Nose: Nose normal.  Mouth/Throat: Oropharynx is clear and moist.  Eyes: Conjunctivae normal are normal. Pupils are equal, round, and reactive to light.  Neck: Neck supple. No thyromegaly present.  Cardiovascular: Normal rate, regular rhythm and normal heart sounds.   Pulmonary/Chest: Effort normal and breath sounds normal.  Lymphadenopathy:    She has no cervical adenopathy.  Skin: Skin is warm and dry.  Psychiatric: She has a normal mood and affect. Her behavior is normal.          Assessment & Plan:  Hypothyroid - Well controlled. On since May 2012.  Will recheck labs in January.   Hormone replacement. - Discussed we can certainly continue these.  Discussed not really for long term use. We can prescribe these for metastases solutions when she is due for her prescription refill and a month. She's currently taking testosterone 1 mg per 0.5 mL. She applies at 0.5 mL's once daily. Normally gets 30 mL per month. Also on progesterone 50 mg per 0.5 mL cream. Apply 0.25 mL at bedtime. Gets 30 mL.  Anxiety - currently under good control. Continue current regimen. I be happy to refill her Xanax when she is due. She has failed to SSRIs at this point to  continue to keep an eye on her. Capsule may be helpful as well as needed in the future.  Insomnia-she also has problems with insomnia but says she is actually been very well-controlled since she started the progesterone supplement.

## 2012-08-19 NOTE — Patient Instructions (Signed)
Dr. Michel Harrow at [facility].

## 2012-10-20 ENCOUNTER — Encounter: Payer: Self-pay | Admitting: Family Medicine

## 2012-10-20 ENCOUNTER — Ambulatory Visit (INDEPENDENT_AMBULATORY_CARE_PROVIDER_SITE_OTHER): Payer: Managed Care, Other (non HMO) | Admitting: Family Medicine

## 2012-10-20 VITALS — BP 106/62 | HR 73 | Resp 18 | Ht 63.0 in | Wt 114.0 lb

## 2012-10-20 DIAGNOSIS — E348 Other specified endocrine disorders: Secondary | ICD-10-CM

## 2012-10-20 DIAGNOSIS — E039 Hypothyroidism, unspecified: Secondary | ICD-10-CM

## 2012-10-20 DIAGNOSIS — E349 Endocrine disorder, unspecified: Secondary | ICD-10-CM

## 2012-10-20 DIAGNOSIS — Z0000 Encounter for general adult medical examination without abnormal findings: Secondary | ICD-10-CM

## 2012-10-20 LAB — TSH: TSH: 4.05 u[IU]/mL (ref 0.350–4.500)

## 2012-10-20 LAB — TESTOSTERONE: Testosterone: <10 ng/dL — ABNORMAL LOW (ref 10–70)

## 2012-10-20 LAB — CBC WITH DIFFERENTIAL/PLATELET
Basophils Relative: 1 % (ref 0–1)
HCT: 37.6 % (ref 36.0–46.0)
Hemoglobin: 12.8 g/dL (ref 12.0–15.0)
Lymphs Abs: 2.6 10*3/uL (ref 0.7–4.0)
MCH: 30 pg (ref 26.0–34.0)
MCHC: 34 g/dL (ref 30.0–36.0)
Monocytes Absolute: 0.5 10*3/uL (ref 0.1–1.0)
Monocytes Relative: 8 % (ref 3–12)
Neutro Abs: 3.1 10*3/uL (ref 1.7–7.7)
RBC: 4.27 MIL/uL (ref 3.87–5.11)

## 2012-10-20 LAB — COMPLETE METABOLIC PANEL WITH GFR
ALT: 29 U/L (ref 0–35)
Alkaline Phosphatase: 53 U/L (ref 39–117)
Creat: 0.81 mg/dL (ref 0.50–1.10)
GFR, Est Non African American: 89 mL/min
Sodium: 141 mEq/L (ref 135–145)
Total Bilirubin: 0.3 mg/dL (ref 0.3–1.2)
Total Protein: 6.8 g/dL (ref 6.0–8.3)

## 2012-10-20 MED ORDER — ALPRAZOLAM 0.25 MG PO TABS: 0.2500 mg | ORAL_TABLET | Freq: Two times a day (BID) | ORAL | Status: DC | PRN

## 2012-10-20 MED ORDER — LEVOTHYROXINE SODIUM 100 MCG PO TABS: 100.0000 ug | ORAL_TABLET | Freq: Every day | ORAL | Status: DC

## 2012-10-20 MED ORDER — AMBULATORY NON FORMULARY MEDICATION: Status: DC

## 2012-10-20 NOTE — Progress Notes (Signed)
  Subjective:     Joann Wilson is a 42 y.o. female and is here for a comprehensive physical exam. The patient reports no problems.  History   Social History  . Marital Status: Married    Spouse Name: Onalee Hua    Number of Children: 2  . Years of Education: college   Occupational History  . Homemaker    Social History Main Topics  . Smoking status: Never Smoker   . Smokeless tobacco: Not on file  . Alcohol Use: Yes     Comment: rarely  . Drug Use: No  . Sexually Active: Yes -- Female partner(s)    Birth Control/ Protection: Condom   Other Topics Concern  . Not on file   Social History Narrative   Does cardio workout 30-45 minutes 3 days per week. No caffeine intake.   Health Maintenance  Topic Date Due  . Influenza Vaccine  06/14/2012  . Pap Smear  06/15/2015  . Tetanus/tdap  04/08/2019    The following portions of the patient's history were reviewed and updated as appropriate: allergies, current medications, past family history, past medical history, past social history, past surgical history and problem list.  Review of Systems A comprehensive review of systems was negative.   Objective:    BP 106/62  Pulse 73  Resp 18  Ht 5\' 3"  (1.6 m)  Wt 114 lb (51.71 kg)  BMI 20.19 kg/m2  SpO2 99%  LMP 10/14/2012 General appearance: alert, cooperative and appears stated age Head: Normocephalic, without obvious abnormality, atraumatic Eyes: conj clear, EOMi, PEERLA Ears: normal TM's and external ear canals both ears Nose: Nares normal. Septum midline. Mucosa normal. No drainage or sinus tenderness. Throat: lips, mucosa, and tongue normal; teeth and gums normal Neck: no adenopathy, no carotid bruit, no JVD, supple, symmetrical, trachea midline and thyroid not enlarged, symmetric, no tenderness/mass/nodules Back: symmetric, no curvature. ROM normal. No CVA tenderness. Lungs: clear to auscultation bilaterally Heart: regular rate and rhythm, S1, S2 normal, no murmur, click, rub or  gallop Abdomen: soft, non-tender; bowel sounds normal; no masses,  no organomegaly Extremities: extremities normal, atraumatic, no cyanosis or edema Pulses: 2+ and symmetric Skin: Skin color, texture, turgor normal. No rashes or lesions Lymph nodes: Cervical, supraclavicular, and axillary nodes normal. Neurologic: Alert and oriented X 3, normal strength and tone. Normal symmetric reflexes. Normal coordination and gait    Assessment:    Healthy female exam.      Plan:     See After Visit Summary for Counseling Recommendations  Keep up a regular exercise program and make sure you are eating a healthy diet Try to eat 4 servings of dairy a day, or if you are lactose intolerant take a calcium with vitamin D daily.  Your vaccines are up to date.    Hypothyroid-recheck TSH today.  Will check CMP. She think she had a fasting lipid panel done over the summer that was normal. EFFECT upon this in her old records which were delivered her office this week. Next  Hormone replacement therapy-she's doing well her current regimen is happy with it. Her mood is well-controlled. I would like to recheck her testosterone level.  Anxiety-currently well controlled. She's only using her Xanax once a day at bedtime at this point. I did write for twice a day so that one prescription should last quite a long time for her.

## 2012-10-22 ENCOUNTER — Telehealth: Payer: Self-pay

## 2012-10-22 MED ORDER — LEVOTHYROXINE SODIUM 112 MCG PO TABS: 112.0000 ug | ORAL_TABLET | Freq: Every day | ORAL | Status: DC

## 2012-10-22 NOTE — Telephone Encounter (Signed)
Prescription sent to pharmacy.

## 2012-10-22 NOTE — Telephone Encounter (Signed)
Patient is ok with the increase of the Synthroid. Please send to pharmacy.

## 2012-11-13 ENCOUNTER — Telehealth: Payer: Self-pay | Admitting: *Deleted

## 2012-11-13 DIAGNOSIS — E039 Hypothyroidism, unspecified: Secondary | ICD-10-CM

## 2012-11-13 LAB — TSH: TSH: 3.91 u[IU]/mL (ref 0.350–4.500)

## 2012-11-13 NOTE — Telephone Encounter (Signed)
Patient advised and will come by for lab.

## 2012-11-13 NOTE — Telephone Encounter (Signed)
Ok let definitely recheck, maybe 112 is too strong.

## 2012-11-13 NOTE — Telephone Encounter (Signed)
Pt calls and states that you changed dose of synthroid from to . Seemed to be doing ok but over the past several months her periods would come 32-35 days. Now periods are lasting a full seven days, migraines, irritable a lot and questions if needs to have levels checked for thyroid and menopausal levels. Sleep is average before thyroid change dose was sleeping real good. On hormones- progesterone cream compounded and testosterone daily except when starts period stops the progesterone for 5 days

## 2012-11-14 NOTE — Telephone Encounter (Signed)
Quick Note:  All labs are normal. ______ 

## 2012-12-17 ENCOUNTER — Ambulatory Visit (INDEPENDENT_AMBULATORY_CARE_PROVIDER_SITE_OTHER): Payer: Managed Care, Other (non HMO) | Admitting: Family Medicine

## 2012-12-17 ENCOUNTER — Encounter: Payer: Self-pay | Admitting: Family Medicine

## 2012-12-17 ENCOUNTER — Telehealth: Payer: Self-pay | Admitting: *Deleted

## 2012-12-17 VITALS — BP 118/68 | HR 80 | Temp 98.4°F | Wt 114.0 lb

## 2012-12-17 DIAGNOSIS — J329 Chronic sinusitis, unspecified: Secondary | ICD-10-CM

## 2012-12-17 DIAGNOSIS — A499 Bacterial infection, unspecified: Secondary | ICD-10-CM

## 2012-12-17 DIAGNOSIS — B9689 Other specified bacterial agents as the cause of diseases classified elsewhere: Secondary | ICD-10-CM

## 2012-12-17 DIAGNOSIS — E039 Hypothyroidism, unspecified: Secondary | ICD-10-CM

## 2012-12-17 MED ORDER — AZITHROMYCIN 250 MG PO TABS: ORAL_TABLET | ORAL | Status: AC

## 2012-12-17 MED ORDER — AZITHROMYCIN 250 MG PO TABS: ORAL_TABLET | ORAL | Status: DC

## 2012-12-17 NOTE — Progress Notes (Signed)
CC: Joann Wilson is a 42 y.o. female is here for Sinusitis   Subjective: HPI:  Patient complains of nasal congestion and right facial pain. This is been present for one week. Nasal congestion has been mild in severity now moderate for the past 2 days. Right facial pain has been moderate since onset. Above symptoms are both worsened with leaning forward. Symptoms are worse first thing in the morning and slightly improved throughout the day. Nothing particularly makes symptoms worse. She has tried afrin with some improvement of symptoms and nasal saline washes with no improvement.. she is trying to avoid decongestants. Symptoms are present all hours of the day but do not interfere her sleep. She denies fevers, chills, cough, shortness of breath, motor or sensory disturbances, hearing loss, chest pain, orthopnea, nausea, nor abdominal pain.   Review Of Systems Outlined In HPI  Past Medical History  Diagnosis Date  . History of anxiety   . Fibrocystic breast      Family History  Problem Relation Age of Onset  . Heart attack Maternal Grandmother 55  . Heart attack Father 4  . Hyperlipidemia Mother   . Hyperlipidemia Father   . Hypertension Mother   . Hypertension Father   . Diabetes Mother   . Diabetes Father      History  Substance Use Topics  . Smoking status: Never Smoker   . Smokeless tobacco: Not on file  . Alcohol Use: Yes     Comment: rarely     Objective: Filed Vitals:   12/17/12 1118  BP: 118/68  Pulse: 80  Temp: 98.4 F (36.9 C)    General: Alert and Oriented, No Acute Distress HEENT: Pupils equal, round, reactive to light. Conjunctivae clear.  External ears unremarkable, canals clear with intact TMs with appropriate landmarks.  Middle ear appears open without effusion. Erythematous and boggy inferior turbinates with moderate mucoid discharge.  Moist mucous membranes, pharynx without inflammation nor lesions.  Neck supple without palpable lymphadenopathy nor  abnormal masses. Right maxillary sinus tenderness to percussion Lungs: Clear to auscultation bilaterally, no wheezing/ronchi/rales.  Comfortable work of breathing. Good air movement. Cardiac: Regular rate and rhythm. Normal S1/S2.  No murmurs, rubs, nor gallops.   Abdomen: Normal bowel sounds, soft and non tender without palpable masses. Neuro: Cranial nerves II through XII grossly intact  Assessment & Plan: Joann Wilson was seen today for sinusitis.  Diagnoses and associated orders for this visit:  Bacterial sinusitis - Discontinue: azithromycin (ZITHROMAX) 250 MG tablet; Take two tabs at once on day 1, then one tab daily on days 2-5. - azithromycin (ZITHROMAX) 250 MG tablet; Take two tabs at once on day 1, then one tab daily on days 2-5.    Bacterial sinusitis: Given duration of symptoms encourage patient to start antimicrobial therapy, she reports intolerance to Augmentin therefore we'll start azithromycin. Encouraged to use nasal saline washes and Alka-Seltzer: Sinus, avoid afrin for longer than 3 days.  Return if symptoms worsen or fail to improve.

## 2012-12-17 NOTE — Telephone Encounter (Signed)
Pt states she talked to someone the other day about having her TSH rechecked. She apparently had been having issues with her period and wanted to have in rechecked in 6 weeks from last check. Per pt request I put order in for labs

## 2012-12-18 ENCOUNTER — Ambulatory Visit: Payer: Managed Care, Other (non HMO) | Admitting: Family Medicine

## 2012-12-18 MED ORDER — LEVOTHYROXINE SODIUM 125 MCG PO TABS: 125.0000 ug | ORAL_TABLET | Freq: Every day | ORAL | Status: DC

## 2013-01-10 NOTE — Progress Notes (Signed)
  Subjective:    Patient ID: Joann Wilson, female    DOB: 13-Mar-1971, 42 y.o.   MRN: [id number]  HPI    Review of Systems     Objective:   Physical Exam        Assessment & Plan:  error

## 2013-02-16 ENCOUNTER — Other Ambulatory Visit: Payer: Self-pay | Admitting: *Deleted

## 2013-02-16 DIAGNOSIS — E785 Hyperlipidemia, unspecified: Secondary | ICD-10-CM

## 2013-02-16 DIAGNOSIS — E039 Hypothyroidism, unspecified: Secondary | ICD-10-CM

## 2013-02-16 LAB — LIPID PANEL
Cholesterol: 146 mg/dL (ref 0–200)
Triglycerides: 71 mg/dL (ref <150)
VLDL: 14 mg/dL (ref 0–40)

## 2013-02-16 LAB — TSH: TSH: 1.659 u[IU]/mL (ref 0.350–4.500)

## 2013-02-16 NOTE — Progress Notes (Signed)
Quick Note:  All labs are normal. ______ 

## 2013-02-17 ENCOUNTER — Other Ambulatory Visit: Payer: Self-pay | Admitting: *Deleted

## 2013-02-17 MED ORDER — LEVOTHYROXINE SODIUM 125 MCG PO TABS: 125.0000 ug | ORAL_TABLET | Freq: Every day | ORAL | Status: DC

## 2013-04-07 ENCOUNTER — Telehealth: Payer: Self-pay | Admitting: Family Medicine

## 2013-04-07 DIAGNOSIS — Z1231 Encounter for screening mammogram for malignant neoplasm of breast: Secondary | ICD-10-CM

## 2013-04-07 NOTE — Telephone Encounter (Signed)
Referral placed.

## 2013-04-07 NOTE — Telephone Encounter (Signed)
Patient called and adv that she has an appointment to see you july7th and would like to have an order for a mammogram put in to have done on the same day. Thanks

## 2013-04-19 ENCOUNTER — Encounter: Payer: Self-pay | Admitting: Family Medicine

## 2013-04-19 ENCOUNTER — Ambulatory Visit (INDEPENDENT_AMBULATORY_CARE_PROVIDER_SITE_OTHER): Payer: Managed Care, Other (non HMO) | Admitting: Family Medicine

## 2013-04-19 VITALS — BP 104/59 | HR 67 | Ht 63.0 in | Wt 115.0 lb

## 2013-04-19 DIAGNOSIS — N951 Menopausal and female climacteric states: Secondary | ICD-10-CM

## 2013-04-19 DIAGNOSIS — K219 Gastro-esophageal reflux disease without esophagitis: Secondary | ICD-10-CM

## 2013-04-19 DIAGNOSIS — F39 Unspecified mood [affective] disorder: Secondary | ICD-10-CM

## 2013-04-19 DIAGNOSIS — Z79890 Hormone replacement therapy (postmenopausal): Secondary | ICD-10-CM

## 2013-04-19 DIAGNOSIS — E039 Hypothyroidism, unspecified: Secondary | ICD-10-CM

## 2013-04-19 LAB — PROGESTERONE: Progesterone: 0.3 ng/mL

## 2013-04-19 LAB — ESTRADIOL: Estradiol: 26.3 pg/mL

## 2013-04-19 MED ORDER — AMBULATORY NON FORMULARY MEDICATION: Status: DC

## 2013-04-19 MED ORDER — LEVOTHYROXINE SODIUM 125 MCG PO TABS: 125.0000 ug | ORAL_TABLET | Freq: Every day | ORAL | Status: DC

## 2013-04-19 NOTE — Patient Instructions (Signed)
We will schedule your pap smear and mammogram at end of Sept/early October We can recheck your thyroid at that time.

## 2013-04-19 NOTE — Progress Notes (Signed)
  Subjective:    Patient ID: Amarie Viles, female    DOB: [birth year], 42 y.o.   MRN: [id number]  HPI Hypothyroid - No recent skin or hair changes.  She is a few pounds heavier than the fall.  No problem with her meds. She is much happier on new dose.  Lab Results  Component Value Date   TSH 1.659 02/16/2013   Mood - has been off the xanax for 5 months. Not on any controller meds.  Doing very well.   GERD- off the omeprazole. Doing well with sxs control.    Thought she was due for gyn exam but last one was last fall and not due until then.   Review of Systems     Objective:   Physical Exam  Constitutional: She is oriented to person, place, and time. She appears well-developed and well-nourished.  HENT:  Head: Normocephalic and atraumatic.  Neck: Neck supple. No thyromegaly present.  Cardiovascular: Normal rate, regular rhythm and normal heart sounds.   Pulmonary/Chest: Effort normal and breath sounds normal.  Lymphadenopathy:    She has no cervical adenopathy.  Neurological: She is alert and oriented to person, place, and time.  Skin: Skin is warm and dry.  Psychiatric: She has a normal mood and affect. Her behavior is normal.          Assessment & Plan:  Hypothyroid - Well contorlled. Will recheck in 4-6 months.  Due for CPE w/ pap at that time.   HRT - Doing well on current regimen. Discussed warning signs of too much meds. Will recheck levels today. Will refill meds.    GRD- well controlled.    Mood- well controlled.

## 2013-04-20 LAB — TESTOSTERONE, FREE, TOTAL, SHBG
Sex Hormone Binding: 34 nmol/L (ref 18–114)
Testosterone, Free: 5.1 pg/mL (ref 0.6–6.8)
Testosterone-% Free: 1.8 % (ref 0.4–2.4)

## 2013-06-25 ENCOUNTER — Encounter: Payer: Self-pay | Admitting: Family Medicine

## 2013-06-25 ENCOUNTER — Ambulatory Visit (INDEPENDENT_AMBULATORY_CARE_PROVIDER_SITE_OTHER): Payer: Managed Care, Other (non HMO) | Admitting: Family Medicine

## 2013-06-25 VITALS — BP 103/57 | HR 75 | Wt 117.0 lb

## 2013-06-25 DIAGNOSIS — Z23 Encounter for immunization: Secondary | ICD-10-CM

## 2013-06-25 DIAGNOSIS — N760 Acute vaginitis: Secondary | ICD-10-CM

## 2013-06-25 MED ORDER — FLUCONAZOLE 150 MG PO TABS: 150.0000 mg | ORAL_TABLET | Freq: Once | ORAL | Status: DC

## 2013-06-25 NOTE — Progress Notes (Signed)
  Subjective:    Patient ID: Joann Wilson, female    DOB: 26-Jul-1971, 42 y.o.   MRN: [id number]  HPI 5 days of vaginal irritation and itchey. Did use the monistat and some better but not completley better. Felt worse this Am. No change in d/c. No abd/pelvic pain. No fever.  Has never had a yeast infection. No urinary sxs.    Review of Systems     Objective:   Physical Exam  Constitutional: She is oriented to person, place, and time. She appears well-developed and well-nourished.  HENT:  Head: Normocephalic and atraumatic.  Eyes: Conjunctivae and EOM are normal.  Cardiovascular: Normal rate.   Pulmonary/Chest: Effort normal.  Neurological: She is alert and oriented to person, place, and time.  Skin: Skin is dry. No pallor.  Psychiatric: She has a normal mood and affect. Her behavior is normal.          Assessment & Plan:  Vaginitis-most likely yeast and she is primarily experiencing itching. Will send her prescriptive for Diflucan. Wet prep collected. We should have results back on Monday. Call if any problems. She's actually due for physical with Pap next week.

## 2013-06-25 NOTE — Patient Instructions (Signed)
Vaginitis Vaginitis is an inflammation of the vagina. It is most often caused by a change in the normal balance of the bacteria and yeast that live in the vagina. This change in balance causes an overgrowth of certain bacteria or yeast, which causes the inflammation. There are different types of vaginitis, but the most common types are:  Bacterial vaginosis.  Yeast infection (candidiasis).  Trichomoniasis vaginitis. This is a sexually transmitted infection (STI).  Viral vaginitis.  Atropic vaginitis.  Allergic vaginitis. CAUSES  The cause depends on the type of vaginitis. Vaginitis can be caused by:  Bacteria (bacterial vaginosis).  Yeast (yeast infection).  A parasite (trichomoniasis vaginitis)  A virus (viral vaginitis).  Low hormone levels (atrophic vaginitis). Low hormone levels can occur during pregnancy, breastfeeding, or after menopause.  Irritants, such as bubble baths, scented tampons, and feminine sprays (allergic vaginitis). Other factors can change the normal balance of the yeast and bacteria that live in the vagina. These include:  Antibiotic medicines.  Poor hygiene.  Diaphragms, vaginal sponges, spermicides, birth control pills, and intrauterine devices (IUD).  Sexual intercourse.  Infection.  Uncontrolled diabetes.  A weakened immune system. SYMPTOMS  Symptoms can vary depending on the cause of the vaginitis. Common symptoms include:  Abnormal vaginal discharge.  The discharge is white, gray, or yellow with bacterial vaginosis.  The discharge is thick, white, and cheesy with a yeast infection.  The discharge is frothy and yellow or greenish with trichomoniasis.  A bad vaginal odor.  The odor is fishy with bacterial vaginosis.  Vaginal itching, pain, or swelling.  Painful intercourse.  Pain or burning when urinating. Sometimes, there are no symptoms. TREATMENT  Treatment will vary depending on the type of infection.   Bacterial  vaginosis and trichomoniasis are often treated with antibiotic creams or pills.  Yeast infections are often treated with antifungal medicines, such as vaginal creams or suppositories.  Viral vaginitis has no cure, but symptoms can be treated with medicines that relieve discomfort. Your sexual partner should be treated as well.  Atrophic vaginitis may be treated with an estrogen cream, pill, suppository, or vaginal ring. If vaginal dryness occurs, lubricants and moisturizing creams may help. You may be told to avoid scented soaps, sprays, or douches.  Allergic vaginitis treatment involves quitting the use of the product that is causing the problem. Vaginal creams can be used to treat the symptoms. HOME CARE INSTRUCTIONS   Take all medicines as directed by your caregiver.  Keep your genital area clean and dry. Avoid soap and only rinse the area with water.  Avoid douching. It can remove the healthy bacteria in the vagina.  Do not use tampons or have sexual intercourse until your vaginitis has been treated. Use sanitary pads while you have vaginitis.  Wipe from front to back. This avoids the spread of bacteria from the rectum to the vagina.  Let air reach your genital area.  Wear cotton underwear to decrease moisture buildup.  Avoid wearing underwear while you sleep until your vaginitis is gone.  Avoid tight pants and underwear or nylons without a cotton panel.  Take off wet clothing (especially bathing suits) as soon as possible.  Use mild, non-scented products. Avoid using irritants, such as:  Scented feminine sprays.  Fabric softeners.  Scented detergents.  Scented tampons.  Scented soaps or bubble baths.  Practice safe sex and use condoms. Condoms may prevent the spread of trichomoniasis and viral vaginitis. SEEK MEDICAL CARE IF:   You have abdominal pain.  You   have a fever or persistent symptoms for more than 2 3 days.  You have a fever and your symptoms suddenly  get worse. Document Released: 07/28/2007 Document Revised: 06/24/2012 Document Reviewed: 03/12/2012 [employer] Patient Information 2014 [employer].  

## 2013-06-26 LAB — WET PREP, GENITAL
Clue Cells Wet Prep HPF POC: NONE SEEN
Trich, Wet Prep: NONE SEEN

## 2013-06-28 ENCOUNTER — Ambulatory Visit (INDEPENDENT_AMBULATORY_CARE_PROVIDER_SITE_OTHER): Payer: Managed Care, Other (non HMO) | Admitting: Family Medicine

## 2013-06-28 ENCOUNTER — Other Ambulatory Visit (HOSPITAL_COMMUNITY)
Admission: RE | Admit: 2013-06-28 | Discharge: 2013-06-28 | Disposition: A | Discharge status: Home / Self Care | Payer: Managed Care, Other (non HMO) | Source: Ambulatory Visit | Attending: Family Medicine | Admitting: Family Medicine

## 2013-06-28 ENCOUNTER — Encounter: Payer: Self-pay | Admitting: Family Medicine

## 2013-06-28 VITALS — BP 106/70 | HR 78 | Wt 116.0 lb

## 2013-06-28 DIAGNOSIS — Z01419 Encounter for gynecological examination (general) (routine) without abnormal findings: Secondary | ICD-10-CM | POA: Insufficient documentation

## 2013-06-28 DIAGNOSIS — Z1151 Encounter for screening for human papillomavirus (HPV): Secondary | ICD-10-CM | POA: Insufficient documentation

## 2013-06-28 DIAGNOSIS — Z124 Encounter for screening for malignant neoplasm of cervix: Secondary | ICD-10-CM

## 2013-06-28 DIAGNOSIS — E039 Hypothyroidism, unspecified: Secondary | ICD-10-CM

## 2013-06-28 NOTE — Progress Notes (Signed)
  Subjective:    Patient ID: Joann Wilson, female    DOB: 06/21/1971, 42 y.o.   MRN: [id number]  HPI Here for Pap only.  Her last Pap smear was about a year ago. She is low risk overall. She's married has any sexual partners. She's never had any abnormal Pap smears.  Hypothyroidism-no recent skin or hair changes. Has been very happy with her dose of meds and with the adjustment. She feels so much better.    Review of Systems     Objective:   Physical Exam  Constitutional: She appears well-developed and well-nourished.  HENT:  Head: Normocephalic and atraumatic.  Genitourinary: Vagina normal and uterus normal. Cervix exhibits no motion tenderness, no discharge and no friability. Right adnexum displays no mass, no tenderness and no fullness. Left adnexum displays no mass, no tenderness and no fullness. No erythema, tenderness or bleeding around the vagina. No signs of injury around the vagina. No vaginal discharge found.  Skin: Skin is warm and dry.  Psychiatric: She has a normal mood and affect. Her behavior is normal.          Assessment & Plan:  Pap smear perfromed - will call her with the results once available.  She has her mammogram scheduled for next week.  Hypothyroidisem - due to recheck levels in about 2 months. One has been present for lab slip today so she can keep things at that time. She's doing real well on her current regimen.

## 2013-07-06 ENCOUNTER — Ambulatory Visit: Payer: Managed Care, Other (non HMO) | Admitting: Family Medicine

## 2013-07-06 ENCOUNTER — Ambulatory Visit (INDEPENDENT_AMBULATORY_CARE_PROVIDER_SITE_OTHER): Payer: Managed Care, Other (non HMO)

## 2013-07-06 DIAGNOSIS — Z1231 Encounter for screening mammogram for malignant neoplasm of breast: Secondary | ICD-10-CM

## 2013-07-16 ENCOUNTER — Other Ambulatory Visit: Payer: Self-pay | Admitting: Family Medicine

## 2013-07-16 DIAGNOSIS — R928 Other abnormal and inconclusive findings on diagnostic imaging of breast: Secondary | ICD-10-CM

## 2013-07-20 ENCOUNTER — Ambulatory Visit
Admission: RE | Admit: 2013-07-20 | Discharge: 2013-07-20 | Disposition: A | Discharge status: Home / Self Care | Payer: 59 | Source: Ambulatory Visit | Attending: Family Medicine | Admitting: Family Medicine

## 2013-07-20 DIAGNOSIS — R928 Other abnormal and inconclusive findings on diagnostic imaging of breast: Secondary | ICD-10-CM

## 2013-07-22 ENCOUNTER — Other Ambulatory Visit: Payer: Managed Care, Other (non HMO)

## 2013-07-27 ENCOUNTER — Other Ambulatory Visit: Payer: Managed Care, Other (non HMO)

## 2013-08-17 LAB — TSH: TSH: 0.366 u[IU]/mL (ref 0.350–4.500)

## 2013-08-18 ENCOUNTER — Telehealth: Payer: Self-pay | Admitting: *Deleted

## 2013-08-18 ENCOUNTER — Other Ambulatory Visit: Payer: Self-pay | Admitting: *Deleted

## 2013-08-18 DIAGNOSIS — E039 Hypothyroidism, unspecified: Secondary | ICD-10-CM

## 2013-08-18 MED ORDER — LEVOTHYROXINE SODIUM 125 MCG PO TABS: 125.0000 ug | ORAL_TABLET | Freq: Every day | ORAL | Status: DC

## 2013-08-18 NOTE — Telephone Encounter (Signed)
Error

## 2013-08-24 ENCOUNTER — Encounter: Payer: Self-pay | Admitting: Family Medicine

## 2013-10-26 ENCOUNTER — Telehealth: Payer: Self-pay | Admitting: Family Medicine

## 2013-10-26 DIAGNOSIS — Z131 Encounter for screening for diabetes mellitus: Secondary | ICD-10-CM

## 2013-10-26 DIAGNOSIS — E039 Hypothyroidism, unspecified: Secondary | ICD-10-CM

## 2013-10-26 NOTE — Telephone Encounter (Signed)
Pt called. She has an appointment on 1/15 for CPE  @ 8:45 and would like order(to include bloodwork for thyroid if needed)to be sent downstairs before her appt(coming in about 8:15).

## 2013-10-26 NOTE — Telephone Encounter (Signed)
What labs would you like and diagnosis. Barry DienesKimberly Tiannah Greenly, LPN

## 2013-10-26 NOTE — Telephone Encounter (Signed)
[facility]. Barry DienesKimberly Gordon, LPN

## 2013-10-26 NOTE — Telephone Encounter (Signed)
Ok for CMP, lipoids, TSH, CBC no diff

## 2013-10-28 ENCOUNTER — Encounter: Payer: Managed Care, Other (non HMO) | Admitting: Family Medicine

## 2013-11-10 ENCOUNTER — Ambulatory Visit (INDEPENDENT_AMBULATORY_CARE_PROVIDER_SITE_OTHER): Payer: Managed Care, Other (non HMO) | Admitting: Family Medicine

## 2013-11-10 ENCOUNTER — Encounter: Payer: Self-pay | Admitting: Family Medicine

## 2013-11-10 VITALS — BP 93/57 | HR 88 | Temp 98.1°F | Ht 63.0 in | Wt 115.0 lb

## 2013-11-10 DIAGNOSIS — E039 Hypothyroidism, unspecified: Secondary | ICD-10-CM

## 2013-11-10 DIAGNOSIS — Z0000 Encounter for general adult medical examination without abnormal findings: Secondary | ICD-10-CM

## 2013-11-10 LAB — CBC
HCT: 38.5 % (ref 36.0–46.0)
HEMOGLOBIN: 13.1 g/dL (ref 12.0–15.0)
MCH: 29.6 pg (ref 26.0–34.0)
MCHC: 34 g/dL (ref 30.0–36.0)
MCV: 87.1 fL (ref 78.0–100.0)
Platelets: 229 10*3/uL (ref 150–400)
RBC: 4.42 MIL/uL (ref 3.87–5.11)
RDW: 13.6 % (ref 11.5–15.5)
WBC: 13 10*3/uL — ABNORMAL HIGH (ref 4.0–10.5)

## 2013-11-10 LAB — COMPLETE METABOLIC PANEL WITH GFR
ALK PHOS: 61 U/L (ref 39–117)
ALT: 13 U/L (ref 0–35)
AST: 21 U/L (ref 0–37)
Albumin: 4.4 g/dL (ref 3.5–5.2)
BILIRUBIN TOTAL: 0.6 mg/dL (ref 0.2–1.2)
BUN: 19 mg/dL (ref 6–23)
CHLORIDE: 103 meq/L (ref 96–112)
CO2: 23 meq/L (ref 19–32)
CREATININE: 0.77 mg/dL (ref 0.50–1.10)
Calcium: 9.2 mg/dL (ref 8.4–10.5)
GLUCOSE: 65 mg/dL — AB (ref 70–99)
POTASSIUM: 4.3 meq/L (ref 3.5–5.3)
Sodium: 138 mEq/L (ref 135–145)
TOTAL PROTEIN: 7.2 g/dL (ref 6.0–8.3)

## 2013-11-10 LAB — TSH: TSH: 0.599 u[IU]/mL (ref 0.350–4.500)

## 2013-11-10 MED ORDER — LEVOTHYROXINE SODIUM 125 MCG PO TABS: 125.0000 ug | ORAL_TABLET | Freq: Every day | ORAL | Status: DC

## 2013-11-10 MED ORDER — AMBULATORY NON FORMULARY MEDICATION: Status: DC

## 2013-11-10 MED ORDER — LEVOTHYROXINE SODIUM 125 MCG PO TABS
125.0000 ug | ORAL_TABLET | Freq: Every day | ORAL | Status: DC
Start: 2013-11-10 — End: 2013-11-10

## 2013-11-10 MED ORDER — SYNTHROID 125 MCG PO TABS: 125.0000 ug | ORAL_TABLET | Freq: Every day | ORAL | Status: DC

## 2013-11-10 NOTE — Progress Notes (Signed)
  Subjective:     Joann Wilson is a 43 y.o. female and is here for a comprehensive physical exam. The patient reports no problems.  History   Social History  . Marital Status: Married    Spouse Name: Onalee HuaDavid    Number of Children: 2  . Years of Education: college   Occupational History  . Homemaker    Social History Main Topics  . Smoking status: Never Smoker   . Smokeless tobacco: Not on file  . Alcohol Use: Yes     Comment: rarely  . Drug Use: No  . Sexual Activity: Yes    Partners: Male    Birth Control/ Protection: Condom   Other Topics Concern  . Not on file   Social History Narrative   Does cardio workout 30-45 minutes 3 days per week. No caffeine intake.   Health Maintenance  Topic Date Due  . Influenza Vaccine  05/14/2014  . Pap Smear  06/28/2016  . Tetanus/tdap  04/08/2019    The following portions of the patient's history were reviewed and updated as appropriate: allergies, current medications, past family history, past medical history, past social history, past surgical history and problem list.  Review of Systems A comprehensive review of systems was negative.   Objective:    BP 93/57  Pulse 88  Temp(Src) 98.1 F (36.7 C)  Ht 5\' 3"  (1.6 m)  Wt 115 lb (52.164 kg)  BMI 20.38 kg/m2  SpO2 97% General appearance: alert, cooperative and appears stated age Head: Normocephalic, without obvious abnormality, atraumatic Eyes: conj clear, EOMI, PEERLA Ears: normal TM's and external ear canals both ears Nose: Nares normal. Septum midline. Mucosa normal. No drainage or sinus tenderness. Throat: lips, mucosa, and tongue normal; teeth and gums normal Neck: no adenopathy, no carotid bruit, no JVD, supple, symmetrical, trachea midline and thyroid not enlarged, symmetric, no tenderness/mass/nodules Back: symmetric, no curvature. ROM normal. No CVA tenderness. Lungs: clear to auscultation bilaterally Heart: regular rate and rhythm, S1, S2 normal, no murmur, click,  rub or gallop Abdomen: soft, non-tender; bowel sounds normal; no masses,  no organomegaly Extremities: extremities normal, atraumatic, no cyanosis or edema Pulses: 2+ and symmetric Skin: Skin color, texture, turgor normal. No rashes or lesions Lymph nodes: Cervical, supraclavicular, and axillary nodes normal. Neurologic: Alert and oriented X 3, normal strength and tone. Normal symmetric reflexes. Normal coordination and gait    Assessment:    Healthy female exam.      Plan:     See After Visit Summary for Counseling Recommendations  Keep up a regular exercise program and make sure you are eating a healthy diet Try to eat 4 servings of dairy a day, or if you are lactose intolerant take a calcium with vitamin D daily.  Your vaccines are up to date.

## 2013-11-16 ENCOUNTER — Encounter: Payer: Self-pay | Admitting: Family Medicine

## 2013-12-23 ENCOUNTER — Encounter: Payer: Self-pay | Admitting: Family Medicine

## 2013-12-24 ENCOUNTER — Other Ambulatory Visit: Payer: Self-pay | Admitting: Family Medicine

## 2013-12-24 DIAGNOSIS — N644 Mastodynia: Secondary | ICD-10-CM

## 2013-12-27 ENCOUNTER — Other Ambulatory Visit: Payer: Self-pay | Admitting: Family Medicine

## 2013-12-27 ENCOUNTER — Telehealth: Payer: Self-pay | Admitting: *Deleted

## 2013-12-27 DIAGNOSIS — N644 Mastodynia: Secondary | ICD-10-CM

## 2013-12-27 NOTE — Telephone Encounter (Signed)
Order has been placed..[name].Joann Wilson, Joann Wilson

## 2013-12-29 LAB — PROGESTERONE: PROGESTERONE: 3.1 ng/mL

## 2013-12-29 LAB — ESTRADIOL: ESTRADIOL: 68.5 pg/mL

## 2013-12-29 LAB — FOLLICLE STIMULATING HORMONE: FSH: 8.6 m[IU]/mL

## 2013-12-29 LAB — LUTEINIZING HORMONE: LH: 10.3 m[IU]/mL

## 2013-12-30 ENCOUNTER — Other Ambulatory Visit: Payer: Self-pay | Admitting: Family Medicine

## 2013-12-30 DIAGNOSIS — N644 Mastodynia: Secondary | ICD-10-CM

## 2013-12-31 ENCOUNTER — Other Ambulatory Visit: Payer: Managed Care, Other (non HMO)

## 2014-01-07 ENCOUNTER — Ambulatory Visit
Admission: RE | Admit: 2014-01-07 | Discharge: 2014-01-07 | Disposition: A | Discharge status: Home / Self Care | Payer: 59 | Source: Ambulatory Visit | Attending: Family Medicine | Admitting: Family Medicine

## 2014-01-07 DIAGNOSIS — N644 Mastodynia: Secondary | ICD-10-CM

## 2014-02-02 ENCOUNTER — Other Ambulatory Visit: Payer: Self-pay | Admitting: Family Medicine

## 2014-02-02 DIAGNOSIS — R5381 Other malaise: Secondary | ICD-10-CM

## 2014-02-02 DIAGNOSIS — R5383 Other fatigue: Secondary | ICD-10-CM

## 2014-02-02 DIAGNOSIS — Z79890 Hormone replacement therapy (postmenopausal): Secondary | ICD-10-CM

## 2014-02-03 LAB — TSH: TSH: 2.597 u[IU]/mL (ref 0.350–4.500)

## 2014-02-03 LAB — VITAMIN B12: VITAMIN B 12: 798 pg/mL (ref 211–911)

## 2014-02-03 LAB — FERRITIN: Ferritin: 13 ng/mL (ref 10–291)

## 2014-02-04 ENCOUNTER — Other Ambulatory Visit: Payer: Self-pay | Admitting: *Deleted

## 2014-02-04 DIAGNOSIS — R790 Abnormal level of blood mineral: Secondary | ICD-10-CM

## 2014-02-04 DIAGNOSIS — E559 Vitamin D deficiency, unspecified: Secondary | ICD-10-CM

## 2014-02-04 LAB — PROGESTERONE: PROGESTERONE: 0.4 ng/mL

## 2014-02-04 LAB — LUTEINIZING HORMONE: LH: 3.8 m[IU]/mL

## 2014-02-04 LAB — ESTRADIOL: Estradiol: 258 pg/mL

## 2014-02-04 LAB — FOLLICLE STIMULATING HORMONE: FSH: 4.1 m[IU]/mL

## 2014-02-04 LAB — VITAMIN D 25 HYDROXY (VIT D DEFICIENCY, FRACTURES): Vit D, 25-Hydroxy: 39 ng/mL (ref 30–89)

## 2014-02-09 LAB — VITAMIN B6: VITAMIN B6: 17.1 ng/mL (ref 2.1–21.7)

## 2014-02-25 ENCOUNTER — Other Ambulatory Visit: Payer: Self-pay | Admitting: Family Medicine

## 2014-02-28 ENCOUNTER — Ambulatory Visit (INDEPENDENT_AMBULATORY_CARE_PROVIDER_SITE_OTHER): Payer: Managed Care, Other (non HMO) | Admitting: Family Medicine

## 2014-02-28 ENCOUNTER — Encounter: Payer: Self-pay | Admitting: Family Medicine

## 2014-02-28 VITALS — BP 106/69 | HR 83 | Wt 115.0 lb

## 2014-02-28 DIAGNOSIS — F411 Generalized anxiety disorder: Secondary | ICD-10-CM

## 2014-02-28 DIAGNOSIS — N920 Excessive and frequent menstruation with regular cycle: Secondary | ICD-10-CM

## 2014-02-28 LAB — WET PREP FOR TRICH, YEAST, CLUE
CLUE CELLS WET PREP: NONE SEEN
Trich, Wet Prep: NONE SEEN
Yeast Wet Prep HPF POC: NONE SEEN

## 2014-02-28 MED ORDER — FLUOXETINE HCL 10 MG PO CAPS: 10.0000 mg | ORAL_CAPSULE | Freq: Every day | ORAL | Status: DC

## 2014-02-28 NOTE — Progress Notes (Signed)
   Subjective:    Patient ID: Joann Wilson, female    DOB: 11/19/1970, 43 y.o.   MRN: [id number]  HPI Has been having prolonged bleeding.  Still having constant bleeding but not heavy. Occ spotting but usally alittle more. Has been having some dull cramping in the abdomen.  She is over 35. She has had a period for [address]21 das.  Slight headache at time. No change in vaginal d/c.  No vaginal itching a few weeks ago.   hx of iron    She would also like to discuss her anxiety-she feels like she is persistently anxious but feels like it's causing more of a problem in her day-to-day life. She says most days are good days but some days she can get very edgy and irritable and anxious. Her father has even commented to her that she may want to talk to her doctor about it. He has similar anxiety type issues and actually takes chronic medication for her. In fact she's actually tried citalopram and Zoloft in the past and it actually triggered panic attacks for her so she is stay away from those medications. Review of Systems     Objective:   Physical Exam  Constitutional: She appears well-developed and well-nourished.  HENT:  Head: Normocephalic and atraumatic.  Cardiovascular: Normal rate.   Genitourinary: Vagina normal. No vaginal discharge found.  Blood in the rectal vault.  Retroverted uterus. Doesn't feel enlarged.   Neurological: She is alert.  Skin: Skin is warm and dry.  Psychiatric: She has a normal mood and affect. Her behavior is normal. Judgment and thought content normal.          Assessment & Plan:  Menorrhagia- I. suspect most likely secondary to having adjusted her progesterone recently. We went back up to the previous dose after her breast tenderness resolved but it may have affected/compromise to the retention of the lining of the uterus. I will also set her up for an ultrasound to make sure that there is no sign of increased thickness indicating some endometrial hyperplasia. She is over  34 consider biopsy of the lining if ultrasound does show increased thickening. Otherwise we will see what her next cycle does now she's back on her consistent dose of progesterone.  Anxiety-discussed different options. She would like to consider going on an SSRI. She has tried citalopram and Zoloft in the past. Warned her that all SSRIs can actually increased anxiety at the beginning. Will start on a very low dose of 10 mg. This will most likely be subtherapeutic and discuss this with her but if she tolerates that well then we can probably feel safe going up to 20 mg. Discussed with her that sometimes SSRIs can initially increase anxiety and to be very patient. Followup in one month if she decides to start the medication.

## 2014-03-01 ENCOUNTER — Ambulatory Visit (INDEPENDENT_AMBULATORY_CARE_PROVIDER_SITE_OTHER): Payer: Managed Care, Other (non HMO)

## 2014-03-01 DIAGNOSIS — N920 Excessive and frequent menstruation with regular cycle: Secondary | ICD-10-CM

## 2014-03-01 DIAGNOSIS — N924 Excessive bleeding in the premenopausal period: Secondary | ICD-10-CM

## 2014-03-02 ENCOUNTER — Other Ambulatory Visit: Payer: Managed Care, Other (non HMO)

## 2014-03-13 ENCOUNTER — Encounter: Payer: Self-pay | Admitting: Family Medicine

## 2014-04-18 ENCOUNTER — Other Ambulatory Visit: Payer: Self-pay | Admitting: Family Medicine

## 2014-04-18 DIAGNOSIS — E039 Hypothyroidism, unspecified: Secondary | ICD-10-CM

## 2014-04-18 DIAGNOSIS — R7989 Other specified abnormal findings of blood chemistry: Secondary | ICD-10-CM

## 2014-04-18 DIAGNOSIS — R790 Abnormal level of blood mineral: Secondary | ICD-10-CM

## 2014-04-21 LAB — TSH: TSH: 1.262 u[IU]/mL (ref 0.350–4.500)

## 2014-04-21 LAB — VITAMIN D 25 HYDROXY (VIT D DEFICIENCY, FRACTURES): VIT D 25 HYDROXY: 65 ng/mL (ref 30–89)

## 2014-04-21 LAB — FERRITIN: Ferritin: 30 ng/mL (ref 10–291)

## 2014-05-12 ENCOUNTER — Encounter: Payer: Self-pay | Admitting: Family Medicine

## 2014-05-12 ENCOUNTER — Telehealth: Payer: Self-pay | Admitting: *Deleted

## 2014-05-12 NOTE — Telephone Encounter (Signed)
This would be highly unusual.  How long did is last?  Was she having a fever, etc.  Has she been taking any decongestants, etc? Is is in her chest more like possible heart burn or esophagitis?

## 2014-05-12 NOTE — Telephone Encounter (Signed)
Pt left vm stating that she had emailed you this morning about a burning sensation from the inside out when taking antidepressants.  She wants to know if this is a normal s/e & should she still continue to take it.

## 2014-05-12 NOTE — Telephone Encounter (Signed)
Her email states that it was her upper & lower back, forearms, & neck.  Her vm stated that this has happened before with other antidepressants.

## 2014-05-17 ENCOUNTER — Telehealth: Payer: Self-pay | Admitting: *Deleted

## 2014-05-17 MED ORDER — HYDROXYZINE HCL 50 MG PO TABS: 50.0000 mg | ORAL_TABLET | Freq: Every evening | ORAL | Status: DC | PRN

## 2014-05-17 NOTE — Telephone Encounter (Signed)
She is on such a low dose of prozac that I'd recommend just stopping it.  I'll send in a rx of hydroxyzine to help with sleep.  F/U with Dr. Judie PetitM when she get's back.

## 2014-05-17 NOTE — Addendum Note (Signed)
Addended by: Deno EtienneBARKLEY, Katlynn Naser L on: 05/17/2014 01:16 PM   Modules accepted: Orders

## 2014-05-17 NOTE — Addendum Note (Signed)
Addended by: Laren BoomHOMMEL, Maylene Crocker on: 05/17/2014 12:08 PM   Modules accepted: Orders

## 2014-05-17 NOTE — Telephone Encounter (Signed)
Pt called and stated that she had spoken w/Dr.Metheney about taking prozac. She has been on the medication for 9 days,  she stated that she has been feeling jittery, anxious, loose stools and her mind has been racing. She has been using benadryl over the past few days to help her to sleep per Dr. Linford ArnoldMetheney. She wanted to know if she could just d/c this or does she need to titrate down. She wants to know if there is anything else that she can use to help her to sleep since the benadryl isn't helping her to sleep she is feeling more anxious. Please advise. Pt can be reached [phone]. She stated that she had an anxiety attack that caused some tightness in her chest however she is doing fine now.

## 2014-05-17 NOTE — Telephone Encounter (Addendum)
Called and informed pt of recommendations. Pt voiced understanding and agreed.Loralee PacasBarkley, Maddex Garlitz HanoverLynetta

## 2014-05-18 ENCOUNTER — Telehealth: Payer: Self-pay | Admitting: *Deleted

## 2014-05-18 NOTE — Telephone Encounter (Addendum)
Took @ 845 pm and slept well however, she feels really loopy she stated that this has helped with the sleep and anxiety but feels that the 50 mg may be too much and wanted to know if this can be cut in half?  She also stated that she feels a "warming" sensation whenever she has taken antidepressants in the past and wanted to know if this is common? Pt also wanted to know if it would be ok to take xanax during the day to take the edge off she currently takes 0.25 mg which is an old rx that was prescribed to her by Dr. Linford ArnoldMetheney and if Dr. Ivan AnchorsHommel feels that this is ok for her to take this. Please advise.Joann PacasBarkley, Joann Wilson ElidaLynetta

## 2014-05-18 NOTE — Telephone Encounter (Signed)
Cutting the hydroxyzine in half to a dose of 25 mg it is perfectly fine and should still be beneficial to help with sleep. The warming sensation she describes is not a common side effect that I know of with the antidepressants I see in her medication history. Since Xanax is a controlled substance recommendations about taking her not taking her medication need to be deferred to Dr. Judie Petit her primary care physician therefore followup with her when she returns next week.

## 2014-05-18 NOTE — Telephone Encounter (Signed)
Pt called and informed of recommendations and she stated she is feeling better, and will f/u with Dr. Linford ArnoldMetheney next week.Joann PacasBarkley, Joann Wilson Joann ValleyLynetta

## 2014-05-24 ENCOUNTER — Encounter: Payer: Self-pay | Admitting: Family Medicine

## 2014-05-24 ENCOUNTER — Ambulatory Visit (INDEPENDENT_AMBULATORY_CARE_PROVIDER_SITE_OTHER): Payer: Managed Care, Other (non HMO) | Admitting: Family Medicine

## 2014-05-24 VITALS — BP 113/69 | HR 73 | Wt 113.0 lb

## 2014-05-24 DIAGNOSIS — Z79890 Hormone replacement therapy (postmenopausal): Secondary | ICD-10-CM

## 2014-05-24 DIAGNOSIS — Z1231 Encounter for screening mammogram for malignant neoplasm of breast: Secondary | ICD-10-CM

## 2014-05-24 DIAGNOSIS — F411 Generalized anxiety disorder: Secondary | ICD-10-CM

## 2014-05-24 DIAGNOSIS — G4700 Insomnia, unspecified: Secondary | ICD-10-CM

## 2014-05-24 MED ORDER — AMBULATORY NON FORMULARY MEDICATION: Status: DC

## 2014-05-24 MED ORDER — SYNTHROID 125 MCG PO TABS: 125.0000 ug | ORAL_TABLET | Freq: Every day | ORAL | Status: DC

## 2014-05-24 MED ORDER — ALPRAZOLAM 0.25 MG PO TABS: 0.2500 mg | ORAL_TABLET | Freq: Every day | ORAL | Status: DC | PRN

## 2014-05-24 NOTE — Progress Notes (Signed)
   Subjective:    Patient ID: Joann Wilson, female    DOB: 12/19/1970, 43 y.o.   MRN: [id number]  HPI Followup on multiple issues today. Joann Wilson had been experiencing some significant anxiety when I last saw her. Joann Wilson waited to feel the fluoxetine after Joann Wilson did some research on top the pharmacist. Joann Wilson took it for about 9 days but unfortunately it caused insomnia and cause a warm flushed burning feeling in her chest and upper arms. It happened most every night. Joann Wilson had called in to the office and we recommended Benadryl at bedtime which helped for the first couple of nights but then seemed to quit working. Joann Wilson also started to feel groggy and sedated after about 4 days of using the Benadryl. Joann Wilson did try the Vistaril as well but found it excessively sedating and felt like Joann Wilson could wake up until almost 4 5:00 in the afternoon the next day. Joann Wilson went ahead and stopped the fluoxetine. Joann Wilson has now been off of it for about 4 or 5 days. Joann Wilson's feeling a little bit better though still not sleeping well at night. Before starting the medication Joann Wilson felt like Joann Wilson was sleeping well.  As far as her hormones are concerned Joann Wilson did go through a fluctuation hormones back in the spring. Joann Wilson had remembered that Joann Wilson had gone to an urgent care and was treated for sinusitis and given a steroid shot. Joann Wilson now has an indentation in the skin in that area. And thinks it may have contributed to some of her increased anxiety.   Review of Systems     Objective:   Physical Exam  Constitutional: Joann Wilson is oriented to person, place, and time. Joann Wilson appears well-developed and well-nourished.  HENT:  Head: Normocephalic and atraumatic.  Eyes: Conjunctivae and EOM are normal.  Cardiovascular: Normal rate.   Pulmonary/Chest: Effort normal.  Neurological: Joann Wilson is alert and oriented to person, place, and time.  Skin: Skin is dry. No pallor.  Psychiatric: Joann Wilson has a normal mood and affect. Her behavior is normal.          Assessment & Plan:   Anxiety disorder-gad 7 score of 8 today. At this point time Joann Wilson wants to stay away from all SSRIs and S. in our eyes. Joann Wilson really wants to just work on this on her own. Certainly we could consider counseling if Joann Wilson would like. I think that Joann Wilson struggling the most with is just irritability and snapping.  Insomnia-secondary to the fluoxetine. Hopefully this will improve over the next week as fluoxetine doesn't have a fairly long half life of for 6 days. If Joann Wilson is still having difficulty sleeping after one week and please let me know. I did give her a small prescription of Xanax to use when necessary. Joann Wilson's well aware of the potential side effects including dependency and says Joann Wilson will use it judiciously. I have prescribed this for her in the past.  Hormone replacement therapy-we'll recheck her hormone levels today. Go ahead and refill her prescription for her topical testosterone and progesterone cream.  Hypothyroidism-recent TSH was well controlled. Refill thyroid medication today.

## 2014-05-25 LAB — PROGESTERONE: Progesterone: 0.4 ng/mL

## 2014-05-25 LAB — ESTRADIOL: Estradiol: 61.8 pg/mL

## 2014-05-30 ENCOUNTER — Encounter: Payer: Self-pay | Admitting: Family Medicine

## 2014-05-30 DIAGNOSIS — D509 Iron deficiency anemia, unspecified: Secondary | ICD-10-CM

## 2014-05-30 DIAGNOSIS — E559 Vitamin D deficiency, unspecified: Secondary | ICD-10-CM

## 2014-05-30 DIAGNOSIS — Z79890 Hormone replacement therapy (postmenopausal): Secondary | ICD-10-CM

## 2014-05-30 DIAGNOSIS — F411 Generalized anxiety disorder: Secondary | ICD-10-CM

## 2014-05-30 DIAGNOSIS — E038 Other specified hypothyroidism: Secondary | ICD-10-CM

## 2014-05-31 ENCOUNTER — Telehealth: Payer: Self-pay | Admitting: *Deleted

## 2014-05-31 DIAGNOSIS — Z1231 Encounter for screening mammogram for malignant neoplasm of breast: Secondary | ICD-10-CM

## 2014-05-31 NOTE — Telephone Encounter (Signed)
3d mammo ordered.

## 2014-07-07 ENCOUNTER — Ambulatory Visit: Payer: Managed Care, Other (non HMO)

## 2014-07-08 ENCOUNTER — Ambulatory Visit
Admission: RE | Admit: 2014-07-08 | Discharge: 2014-07-08 | Disposition: A | Discharge status: Home / Self Care | Payer: 59 | Source: Ambulatory Visit | Attending: Family Medicine | Admitting: Family Medicine

## 2014-07-08 DIAGNOSIS — Z1231 Encounter for screening mammogram for malignant neoplasm of breast: Secondary | ICD-10-CM

## 2014-09-13 ENCOUNTER — Encounter: Payer: Self-pay | Admitting: Family Medicine

## 2014-09-13 ENCOUNTER — Ambulatory Visit (INDEPENDENT_AMBULATORY_CARE_PROVIDER_SITE_OTHER): Payer: 59 | Admitting: Family Medicine

## 2014-09-13 VITALS — BP 113/76 | HR 86 | Temp 99.1°F | Wt 118.0 lb

## 2014-09-13 DIAGNOSIS — J0110 Acute frontal sinusitis, unspecified: Secondary | ICD-10-CM

## 2014-09-13 MED ORDER — AMOXICILLIN-POT CLAVULANATE 875-125 MG PO TABS: 1.0000 | ORAL_TABLET | Freq: Two times a day (BID) | ORAL | Status: DC

## 2014-09-13 NOTE — Progress Notes (Signed)
   Subjective:    Patient ID: Joann Wilson, female    DOB: 05/02/1971, 43 y.o.   MRN: [id number]  HPI Started out a a cold about  6 weeks ago and had been using mucinex. Was getting some better until about 6 days ago.  Started getting more congestion and cough and now seeing brown colored mucous. It is triggering headaches. Says the right side of the face has been hurting.  Has had thick post nasal mucous.     Review of Systems     Objective:   Physical Exam  Constitutional: She is oriented to person, place, and time. She appears well-developed and well-nourished.  HENT:  Head: Normocephalic and atraumatic.  Right Ear: External ear normal.  Left Ear: External ear normal.  Nose: Nose normal.  Mouth/Throat: Oropharynx is clear and moist.  TMs and canals are clear.   Eyes: Conjunctivae and EOM are normal. Pupils are equal, round, and reactive to light.  Neck: Neck supple. No thyromegaly present.  Cardiovascular: Normal rate, regular rhythm and normal heart sounds.   Pulmonary/Chest: Effort normal and breath sounds normal. She has no wheezes.  Lymphadenopathy:    She has no cervical adenopathy.  Neurological: She is alert and oriented to person, place, and time.  Skin: Skin is warm and dry.  Psychiatric: She has a normal mood and affect.          Assessment & Plan:  Acute right maxillary sinusitis-will treat with Augmentin. Call significantly better in one week. Okay to continue symptom Medicare.

## 2014-09-26 NOTE — Telephone Encounter (Signed)
Would you like to add other test to TSH, Iron-TIBC, Ferritin and Vitamin D.

## 2014-09-27 ENCOUNTER — Other Ambulatory Visit: Payer: Self-pay | Admitting: Family Medicine

## 2014-09-27 LAB — FERRITIN: FERRITIN: 43 ng/mL (ref 10–291)

## 2014-09-27 LAB — IRON AND TIBC
%SAT: 38 % (ref 20–55)
Iron: 121 ug/dL (ref 42–145)
TIBC: 316 ug/dL (ref 250–470)
UIBC: 195 ug/dL (ref 125–400)

## 2014-09-27 LAB — TSH: TSH: 0.687 u[IU]/mL (ref 0.350–4.500)

## 2014-09-28 LAB — ESTRADIOL: Estradiol: 63.2 pg/mL

## 2014-09-28 LAB — PROGESTERONE: Progesterone: 0.3 ng/mL

## 2014-09-28 LAB — FOLLICLE STIMULATING HORMONE: FSH: 9.5 m[IU]/mL

## 2014-09-28 LAB — LUTEINIZING HORMONE: LH: 7.3 m[IU]/mL

## 2014-09-28 LAB — VITAMIN D 25 HYDROXY (VIT D DEFICIENCY, FRACTURES): VIT D 25 HYDROXY: 50 ng/mL (ref 30–100)

## 2014-09-30 ENCOUNTER — Telehealth: Payer: Self-pay

## 2014-09-30 MED ORDER — FLUCONAZOLE 150 MG PO TABS: ORAL_TABLET | ORAL | Status: DC

## 2014-09-30 NOTE — Telephone Encounter (Signed)
Patient called and left message about having a yeast infection. I tried to call her back, no answer, left message. Sent diflucan to pharmacy.

## 2014-10-09 ENCOUNTER — Encounter: Payer: Self-pay | Admitting: Family Medicine

## 2014-10-10 ENCOUNTER — Other Ambulatory Visit: Payer: Self-pay

## 2014-10-10 MED ORDER — AMBULATORY NON FORMULARY MEDICATION: Status: DC

## 2014-11-02 ENCOUNTER — Encounter: Payer: 59 | Admitting: Family Medicine

## 2014-11-09 ENCOUNTER — Encounter: Payer: 59 | Admitting: Family Medicine

## 2014-11-18 ENCOUNTER — Encounter: Payer: Self-pay | Admitting: Family Medicine

## 2014-11-18 ENCOUNTER — Ambulatory Visit (INDEPENDENT_AMBULATORY_CARE_PROVIDER_SITE_OTHER): Payer: 59 | Admitting: Family Medicine

## 2014-11-18 VITALS — BP 118/76 | HR 78 | Wt 121.0 lb

## 2014-11-18 DIAGNOSIS — Z0000 Encounter for general adult medical examination without abnormal findings: Secondary | ICD-10-CM

## 2014-11-18 LAB — LIPID PANEL
CHOL/HDL RATIO: 2.8 ratio
CHOLESTEROL: 158 mg/dL (ref 0–200)
HDL: 57 mg/dL (ref >39)
LDL CALC: 90 mg/dL (ref 0–99)
Triglycerides: 55 mg/dL (ref <150)
VLDL: 11 mg/dL (ref 0–40)

## 2014-11-18 LAB — COMPLETE METABOLIC PANEL WITH GFR
ALBUMIN: 4.5 g/dL (ref 3.5–5.2)
ALT: 19 U/L (ref 0–35)
AST: 25 U/L (ref 0–37)
Alkaline Phosphatase: 56 U/L (ref 39–117)
BUN: 18 mg/dL (ref 6–23)
CALCIUM: 9 mg/dL (ref 8.4–10.5)
CO2: 27 mEq/L (ref 19–32)
CREATININE: 0.82 mg/dL (ref 0.50–1.10)
Chloride: 103 mEq/L (ref 96–112)
GFR, EST NON AFRICAN AMERICAN: 87 mL/min
GFR, Est African American: >89 mL/min
GLUCOSE: 82 mg/dL (ref 70–99)
Potassium: 4.3 mEq/L (ref 3.5–5.3)
Sodium: 139 mEq/L (ref 135–145)
Total Bilirubin: 0.5 mg/dL (ref 0.2–1.2)
Total Protein: 6.9 g/dL (ref 6.0–8.3)

## 2014-11-18 MED ORDER — SYNTHROID 125 MCG PO TABS: 125.0000 ug | ORAL_TABLET | Freq: Every day | ORAL | Status: DC

## 2014-11-18 NOTE — Progress Notes (Signed)
  Subjective:     Joann Wilson is a 44 y.o. female and is here for a comprehensive physical exam. The patient reports no problems.  History   Social History  . Marital Status: Married    Spouse Name: Onalee HuaDavid    Number of Children: 2  . Years of Education: college   Occupational History  . Homemaker    Social History Main Topics  . Smoking status: Never Smoker   . Smokeless tobacco: Not on file  . Alcohol Use: Yes     Comment: rarely  . Drug Use: No  . Sexual Activity:    Partners: Male    Birth Control/ Protection: Condom   Other Topics Concern  . Not on file   Social History Narrative   Does cardio workout 30-45 minutes 3 days per week. No caffeine intake.   Health Maintenance  Topic Date Due  . INFLUENZA VACCINE  05/15/2015  . PAP SMEAR  06/28/2016  . TETANUS/TDAP  04/08/2019  . HIV Screening  Completed    The following portions of the patient's history were reviewed and updated as appropriate: allergies, current medications, past family history, past medical history, past social history, past surgical history and problem list.  Review of Systems A comprehensive review of systems was negative.   Objective:    BP 118/76 mmHg  Pulse 78  Wt 121 lb (54.885 kg)  SpO2 99% General appearance: alert, cooperative and appears stated age Head: Normocephalic, without obvious abnormality, atraumatic Eyes: PEERLA, conju clear,  Ears: normal TM's and external ear canals both ears Nose: Nares normal. Septum midline. Mucosa normal. No drainage or sinus tenderness. Throat: lips, mucosa, and tongue normal; teeth and gums normal Neck: no adenopathy, no carotid bruit, no JVD, supple, symmetrical, trachea midline and thyroid not enlarged, symmetric, no tenderness/mass/nodules Back: symmetric, no curvature. ROM normal. No CVA tenderness. Lungs: clear to auscultation bilaterally Breasts: normal appearance, no masses or tenderness Heart: regular rate and rhythm, S1, S2 normal, no  murmur, click, rub or gallop Abdomen: soft, non-tender; bowel sounds normal; no masses,  no organomegaly Extremities: extremities normal, atraumatic, no cyanosis or edema Pulses: 2+ and symmetric Skin: Skin color, texture, turgor normal. No rashes or lesions Lymph nodes: Cervical, supraclavicular, and axillary nodes normal. Neurologic: Alert and oriented X 3, normal strength and tone. Normal symmetric reflexes. Normal coordination and gait    Assessment:    Healthy female exam.      Plan:     See After Visit Summary for Counseling Recommendations  Keep up a regular exercise program and make sure you are eating a healthy diet Try to eat 4 servings of dairy a day, or if you are lactose intolerant take a calcium with vitamin D daily.  Your vaccines are up to date.  CMP and Lipid Pap smear is UTD. Reviewed new guidelines.    Reviewed hormones with her as well.

## 2014-11-18 NOTE — Patient Instructions (Signed)
complete physical examination Keep up a regular exercise program and make sure you are eating a healthy diet Try to eat 4 servings of dairy a day, or if you are lactose intolerant take a calcium with vitamin D daily.  Your vaccines are up to date.    Smart Phone app [employer]. Free to download.

## 2014-11-25 ENCOUNTER — Encounter: Payer: Self-pay | Admitting: Family Medicine

## 2014-11-25 ENCOUNTER — Other Ambulatory Visit: Payer: Self-pay | Admitting: Family Medicine

## 2015-02-14 ENCOUNTER — Ambulatory Visit (INDEPENDENT_AMBULATORY_CARE_PROVIDER_SITE_OTHER): Payer: 59 | Admitting: Physician Assistant

## 2015-02-14 ENCOUNTER — Encounter: Payer: Self-pay | Admitting: Physician Assistant

## 2015-02-14 VITALS — BP 111/74 | HR 75 | Wt 118.0 lb

## 2015-02-14 DIAGNOSIS — R2232 Localized swelling, mass and lump, left upper limb: Secondary | ICD-10-CM

## 2015-02-14 NOTE — Patient Instructions (Addendum)
Ganglion cyst/superfical blood clot    Ganglion Cyst A ganglion cyst is a noncancerous, fluid-filled lump that occurs near joints or tendons. The ganglion cyst grows out of a joint or the lining of a tendon. It most often develops in the hand or wrist but can also develop in the shoulder, elbow, hip, knee, ankle, or foot. The round or oval ganglion can be pea sized or larger than a grape. Increased activity may enlarge the size of the cyst because more fluid starts to build up.  CAUSES  It is not completely known what causes a ganglion cyst to grow. However, it may be related to:  Inflammation or irritation around the joint.  An injury.  Repetitive movements or overuse.  Arthritis. SYMPTOMS  A lump most often appears in the hand or wrist, but can occur in other areas of the body. Generally, the lump is painless without other symptoms. However, sometimes pain can be felt during activity or when pressure is applied to the lump. The lump may even be tender to the touch. Tingling, pain, numbness, or muscle weakness can occur if the ganglion cyst presses on a nerve. Your grip may be weak and you may have less movement in your joints.  DIAGNOSIS  Ganglion cysts are most often diagnosed based on a physical exam, noting where the cyst is and how it looks. Your caregiver will feel the lump and may shine a light alongside it. If it is a ganglion, a light often shines through it. Your caregiver may order an X-ray, ultrasound, or MRI to rule out other conditions. TREATMENT  Ganglions usually go away on their own without treatment. If pain or other symptoms are involved, treatment may be needed. Treatment is also needed if the ganglion limits your movement or if it gets infected. Treatment options include:  Wearing a wrist or finger brace or splint.  Taking anti-inflammatory medicine.  Draining fluid from the lump with a needle (aspiration).  Injecting a steroid into the joint.  Surgery to remove  the ganglion cyst and its stalk that is attached to the joint or tendon. However, ganglion cysts can grow back. HOME CARE INSTRUCTIONS   Do not press on the ganglion, poke it with a needle, or hit it with a heavy object. You may rub the lump gently and often. Sometimes fluid moves out of the cyst.  Only take medicines as directed by your caregiver.  Wear your brace or splint as directed by your caregiver. SEEK MEDICAL CARE IF:   Your ganglion becomes larger or more painful.  You have increased redness, red streaks, or swelling.  You have pus coming from the lump.  You have weakness or numbness in the affected area. MAKE SURE YOU:   Understand these instructions.  Will watch your condition.  Will get help right away if you are not doing well or get worse. Document Released: 09/27/2000 Document Revised: 06/24/2012 Document Reviewed: 11/24/2007 [facility] Patient Information 2015 [employer] [place]. This information is not intended to replace advice given to you by your health care provider. Make sure you discuss any questions you have with your health care provider.

## 2015-02-14 NOTE — Progress Notes (Signed)
   Subjective:    Patient ID: Joann Wilson, female    DOB: 04/26/1971, 44 y.o.   MRN: [id number]  HPI  Pt presents to the clinic with a small lump on left forearm for last few months. Some minor tenderness with palpation but no pain, redness, swelling. Does seem like veins around it are getting more prominent. Not tried anything to make better. Seems to get bigger on its own. No fever, chills, other lumps other placed. Never had anything like this before.     Review of Systems  All other systems reviewed and are negative.      Objective:   Physical Exam  Lymphadenopathy:    She has no cervical adenopathy.  Skin:  Left forearm: small 3mm mobile, non tender, no swelling or redness nodule that seems to sit adjacent or on top of vein.           Assessment & Plan:  Left forearm nodule- unclear etiology cyst vs lymph node vs lipoma. Will get ultrasound since seems to be growing. CBC ordered.

## 2015-02-15 ENCOUNTER — Ambulatory Visit (INDEPENDENT_AMBULATORY_CARE_PROVIDER_SITE_OTHER): Payer: 59

## 2015-02-15 ENCOUNTER — Other Ambulatory Visit: Payer: Self-pay | Admitting: Physician Assistant

## 2015-02-15 ENCOUNTER — Encounter: Payer: Self-pay | Admitting: Physician Assistant

## 2015-02-15 DIAGNOSIS — R2230 Localized swelling, mass and lump, unspecified upper limb: Secondary | ICD-10-CM | POA: Insufficient documentation

## 2015-02-15 DIAGNOSIS — R2232 Localized swelling, mass and lump, left upper limb: Secondary | ICD-10-CM

## 2015-02-15 LAB — CBC WITH DIFFERENTIAL/PLATELET
Basophils Absolute: 0 10*3/uL (ref 0.0–0.1)
Basophils Relative: 0 % (ref 0–1)
EOS ABS: 0.2 10*3/uL (ref 0.0–0.7)
Eosinophils Relative: 2 % (ref 0–5)
HCT: 37.8 % (ref 36.0–46.0)
HEMOGLOBIN: 12.7 g/dL (ref 12.0–15.0)
Lymphocytes Relative: 31 % (ref 12–46)
Lymphs Abs: 3.6 10*3/uL (ref 0.7–4.0)
MCH: 30.3 pg (ref 26.0–34.0)
MCHC: 33.6 g/dL (ref 30.0–36.0)
MCV: 90.2 fL (ref 78.0–100.0)
MPV: 10.7 fL (ref 8.6–12.4)
Monocytes Absolute: 1 10*3/uL (ref 0.1–1.0)
Monocytes Relative: 9 % (ref 3–12)
NEUTROS PCT: 58 % (ref 43–77)
Neutro Abs: 6.7 10*3/uL (ref 1.7–7.7)
Platelets: 236 10*3/uL (ref 150–400)
RBC: 4.19 MIL/uL (ref 3.87–5.11)
RDW: 13.8 % (ref 11.5–15.5)
WBC: 11.5 10*3/uL — ABNORMAL HIGH (ref 4.0–10.5)

## 2015-02-20 ENCOUNTER — Ambulatory Visit (INDEPENDENT_AMBULATORY_CARE_PROVIDER_SITE_OTHER): Payer: 59

## 2015-02-20 DIAGNOSIS — M79632 Pain in left forearm: Secondary | ICD-10-CM

## 2015-02-20 DIAGNOSIS — R2232 Localized swelling, mass and lump, left upper limb: Secondary | ICD-10-CM

## 2015-02-20 MED ORDER — GADOBENATE DIMEGLUMINE 529 MG/ML IV SOLN: 10.0000 mL | Freq: Once | INTRAVENOUS | Status: AC | PRN

## 2015-05-12 ENCOUNTER — Encounter: Payer: Self-pay | Admitting: Family Medicine

## 2015-05-12 ENCOUNTER — Ambulatory Visit (INDEPENDENT_AMBULATORY_CARE_PROVIDER_SITE_OTHER): Payer: 59 | Admitting: Family Medicine

## 2015-05-12 VITALS — BP 107/70 | HR 77 | Temp 98.1°F | Wt 119.0 lb

## 2015-05-12 DIAGNOSIS — E611 Iron deficiency: Secondary | ICD-10-CM

## 2015-05-12 DIAGNOSIS — Z1231 Encounter for screening mammogram for malignant neoplasm of breast: Secondary | ICD-10-CM

## 2015-05-12 DIAGNOSIS — D509 Iron deficiency anemia, unspecified: Secondary | ICD-10-CM

## 2015-05-12 DIAGNOSIS — Z79890 Hormone replacement therapy (postmenopausal): Secondary | ICD-10-CM

## 2015-05-12 DIAGNOSIS — E038 Other specified hypothyroidism: Secondary | ICD-10-CM

## 2015-05-12 LAB — FERRITIN: Ferritin: 47 ng/mL (ref 10–291)

## 2015-05-12 LAB — TSH: TSH: 1.582 u[IU]/mL (ref 0.350–4.500)

## 2015-05-12 LAB — HEMOGLOBIN: Hemoglobin: 13.3 g/dL (ref 12.0–15.0)

## 2015-05-12 MED ORDER — ALPRAZOLAM 0.25 MG PO TABS: 0.2500 mg | ORAL_TABLET | Freq: Every day | ORAL | Status: DC | PRN

## 2015-05-12 MED ORDER — AMBULATORY NON FORMULARY MEDICATION: Status: DC

## 2015-05-12 NOTE — Progress Notes (Signed)
   Subjective:    Patient ID: Joann Wilson, female    DOB: [birth year], 44 y.o.   MRN: [id number]  HPI Six-month follow-up for hypothyroidism-no recent skin or hair changes. No significant weight changes in energy levels are okay.s she has been exercising.  She says she actually feels great and is doing really well.  History of iron deficiency-I has been having her hold her iron and we are due to recheck levels to see if she's been able to maintain them on her own with her regular diet.   Hormone replacement therapy- she doesn't need a refill on medications yet but will between now and her next visit in 6 months.   Review of Systems     Objective:   Physical Exam  Constitutional: She is oriented to person, place, and time. She appears well-developed and well-nourished.  HENT:  Head: Normocephalic and atraumatic.  Neck: Neck supple. No thyromegaly present.  Cardiovascular: Normal rate, regular rhythm and normal heart sounds.   Pulmonary/Chest: Effort normal and breath sounds normal.  Lymphadenopathy:    She has no cervical adenopathy.  Neurological: She is alert and oriented to person, place, and time.  Skin: Skin is warm and dry.  Psychiatric: She has a normal mood and affect. Her behavior is normal.          Assessment & Plan:  Hypothyroid - symptomatically she is very well controlled. Due to recheck TSH. We'll call with results once available and then we can send a refill to her pharmacy.   Iron deficiency - Due to recheck levels to see if she's able to maintain her iron levels on her own with just diet she is now off of replacement therapy.  HRT - will recheck him on levels and adjust bioidentical hormone therapy if needed. Patient otherwise doing well so I do not suspect that we will need to adjust anything.  Due for screening mammogram. We discussed benefits of 3 mammograms that she does have more dense breast tissue. She's not sure if insurance will cover it. I would have  place referral today and encouraged her to speak with the imaging center about options.

## 2015-05-13 LAB — FOLLICLE STIMULATING HORMONE: FSH: 7.9 m[IU]/mL

## 2015-05-13 LAB — ESTRADIOL: ESTRADIOL: 47.2 pg/mL

## 2015-05-13 LAB — PROGESTERONE: Progesterone: 0.5 ng/mL

## 2015-05-13 LAB — LUTEINIZING HORMONE: LH: 5.8 m[IU]/mL

## 2015-05-15 ENCOUNTER — Telehealth: Payer: Self-pay | Admitting: *Deleted

## 2015-05-15 ENCOUNTER — Other Ambulatory Visit: Payer: Self-pay | Admitting: *Deleted

## 2015-05-15 MED ORDER — AMBULATORY NON FORMULARY MEDICATION: Status: DC

## 2015-05-15 MED ORDER — SYNTHROID 125 MCG PO TABS: ORAL_TABLET | ORAL | Status: DC

## 2015-05-15 NOTE — Telephone Encounter (Signed)
Pt called and confirmed the dosage of the Testosterone and progesterone. I informed her that there has not been any changes to this it has been written this way since 2014. Laureen Ochs, Viann Shove

## 2015-05-19 ENCOUNTER — Ambulatory Visit: Payer: 59 | Admitting: Family Medicine

## 2015-06-30 ENCOUNTER — Encounter: Payer: Self-pay | Admitting: Osteopathic Medicine

## 2015-06-30 ENCOUNTER — Ambulatory Visit (INDEPENDENT_AMBULATORY_CARE_PROVIDER_SITE_OTHER): Payer: 59 | Admitting: Osteopathic Medicine

## 2015-06-30 VITALS — BP 105/65 | HR 109 | Ht 63.0 in | Wt 120.0 lb

## 2015-06-30 DIAGNOSIS — R0981 Nasal congestion: Secondary | ICD-10-CM

## 2015-06-30 DIAGNOSIS — J069 Acute upper respiratory infection, unspecified: Secondary | ICD-10-CM

## 2015-06-30 DIAGNOSIS — J0120 Acute ethmoidal sinusitis, unspecified: Secondary | ICD-10-CM | POA: Diagnosis not present

## 2015-06-30 MED ORDER — AMOXICILLIN-POT CLAVULANATE 875-125 MG PO TABS: 1.0000 | ORAL_TABLET | Freq: Two times a day (BID) | ORAL | Status: DC

## 2015-06-30 MED ORDER — FLUTICASONE PROPIONATE 50 MCG/ACT NA SUSP: 2.0000 | Freq: Every day | NASAL | Status: DC

## 2015-06-30 NOTE — Patient Instructions (Signed)
Fill prescription for AUgmentin for sinus infectio nif you are not feeling better in 3 - 4 days, or if you are feeling significantly worse. Return to clinic as directed by your primary physician, sooner if any problems arise.

## 2015-06-30 NOTE — Progress Notes (Signed)
HPI: Joann Wilson is a 44 y.o. female who presents to [facility]  today for chief complaint of:  Chief Complaint  Patient presents with  . Sinusitis    . Location: sinuses, worse on R . Quality: sore, congestions . Severity: moderate . Duration: 2 - 3 days . Modifying factors: has tried Mucinex, Afrin, Claritin  . Assoc signs/symptoms: no fever/chills   Past medical, social and family history reviewed: Past Medical History  Diagnosis Date  . History of anxiety   . Fibrocystic breast    Past Surgical History  Procedure Laterality Date  . No past surgeries     Social History  Substance Use Topics  . Smoking status: Never Smoker   . Smokeless tobacco: Not on file  . Alcohol Use: Yes     Comment: rarely   Family History  Problem Relation Age of Onset  . Heart attack Maternal Grandmother 55  . Heart attack Father 47  . Hyperlipidemia Mother   . Hyperlipidemia Father   . Hypertension Mother   . Hypertension Father   . Diabetes Mother   . Diabetes Father     Current Outpatient Prescriptions  Medication Sig Dispense Refill  . ALPRAZolam (XANAX) 0.25 MG tablet Take 1 tablet (0.25 mg total) by mouth daily as needed for anxiety. 20 tablet 0  . AMBULATORY NON FORMULARY MEDICATION Medication Name: testosterone 1mg /0.59ml.  Apply 0.79ml once daily 30 mL 6  . AMBULATORY NON FORMULARY MEDICATION Medication Name: progesterone 50 mg per 0.5 mL cream. Apply 0.25 mL at bedtime. 30 mL 6  . IRON PO Take by mouth daily.    . Magnesium 250 MG TABS Take 1 tablet by mouth at bedtime.    . Multiple Vitamins-Minerals (MULTIVITAMIN WITH MINERALS) tablet Take 1 tablet by mouth daily.    Marland Kitchen SYNTHROID 125 MCG tablet TAKE ONE TABLET BY MOUTH DAILY BEFORE BREAKFAST 90 tablet 1   No current facility-administered medications for this visit.   Allergies  Allergen Reactions  . Citalopram Other (See Comments)    Felt numb all over   . Fluoxetine Other (See  Comments)    Insomnia  . Zoloft [Sertraline Hcl] Other (See Comments)    Felt numb all over      Review of Systems: CONSTITUTIONAL: Neg fever/chills, no unintentional weight changes HEAD/EYES/EARS/NOSE/THROAT: No headache/vision change or hearing change, mild sore throat, mild R ear pain CARDIAC: No chest pain/pressure/palpitations, no orthopnea RESPIRATORY: No cough/shortness of breath/wheeze MUSCULOSKELETAL: No myalgia/arthralgia   Exam:  BP 105/65 mmHg  Pulse 109  Ht 5\' 3"  (1.6 m)  Wt 120 lb (54.432 kg)  BMI 21.26 kg/m2  SpO2 95% Constitutional: VSS, see above. General Appearance: alert, well-developed, well-nourished, NAD Eyes: Normal lids and conjunctive, non-icteric sclera, PERRLA Ears, Nose, Mouth, Throat: Normal external inspection ears/nares/mouth/lips/gums, Normal TM bilaterally with some clear effusion behind R TM, MMM, posterior pharynx without exudate, (+) mild erythema Neck: No masses, trachea midline. No thyroid enlargement/tenderness/mass appreciated Respiratory: Normal respiratory effort. no wheeze/rhonchi/rales Cardiovascular: S1/S2 normal, no murmur/rub/gallop auscultated. RRR. No carotid bruit or JVD. No abdominal aortic bruit. Pedal pulse II/IV bilaterally DP and PT. No lower extremity edema.  No results found for this or any previous visit (from the past 72 hour(s)).    ASSESSMENT/PLAN:   Acute ethmoidal sinusitis, recurrence not specified - Plan: amoxicillin-clavulanate (AUGMENTIN) 875-125 MG per tablet  Nasal congestion - Plan: fluticasone (FLONASE) 50 MCG/ACT nasal spray  Viral URI  Fill abx if no improvement.  RTC  any concerns.  Supportive care discussed OMT: lymphatic drainage technique, sinus effleurage to some relief

## 2015-07-06 IMAGING — US US MISC SOFT TISSUE
1 series · 14 of 16 positions shown · non-contrast
Comparison: none

CLINICAL DATA: Palpable superficial in a and bruising over the
dorsum of the mid forearm for the past 1-2 months, no erythema.

EXAM:
SOFT TISSUE ULTRASOUND - MISCELLANEOUS

[Series 1: us misc soft tissue · 0.03mm/px · 14 of 17 slices shown]
[im 1/17]
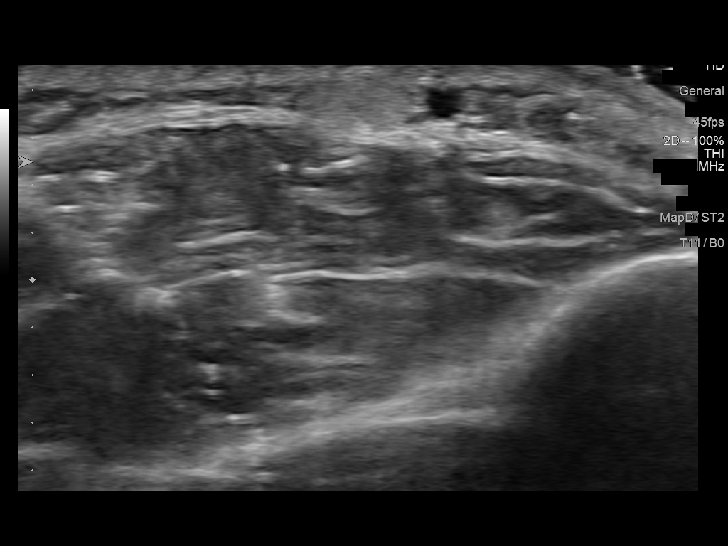
[im 2/17]
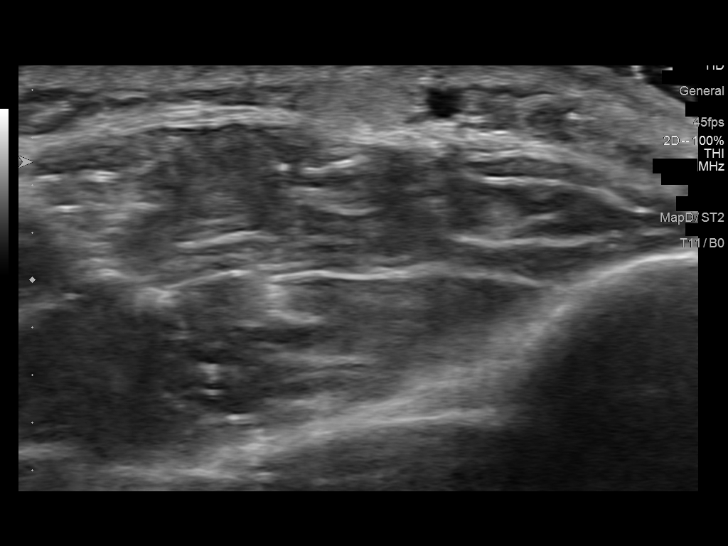
[im 3/17]
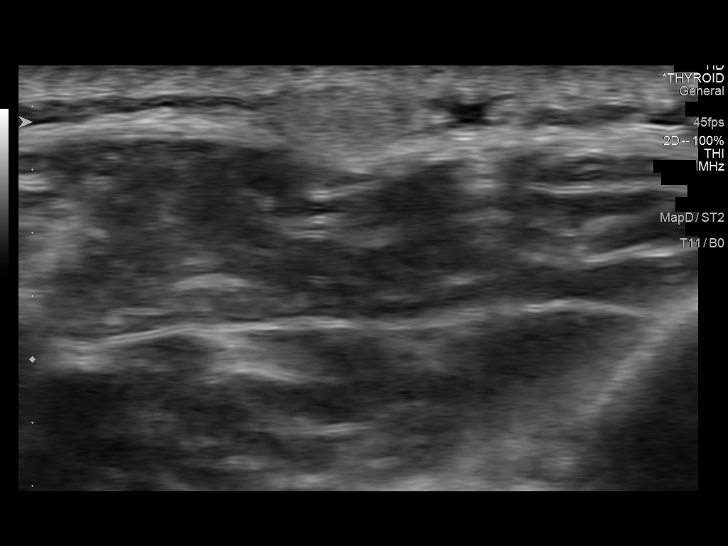
[im 5/17]
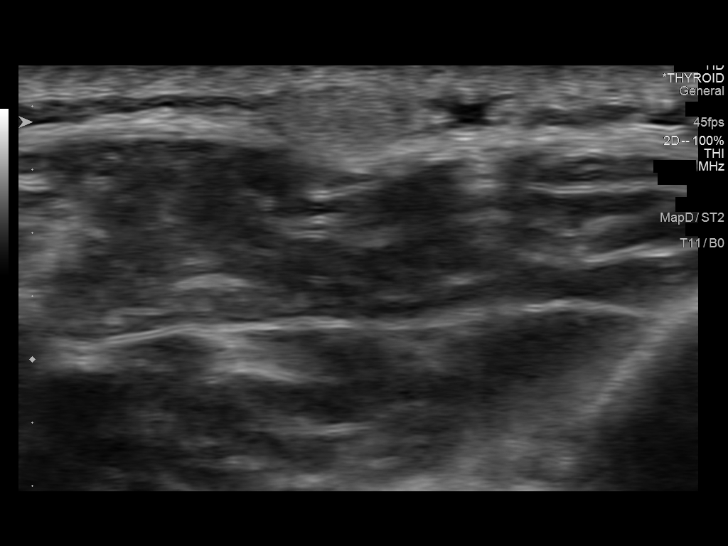
[im 6/17]
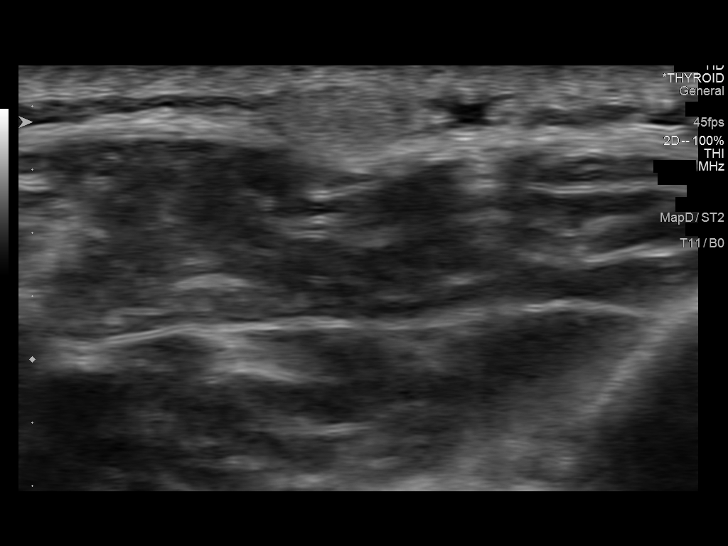
[im 7/17]
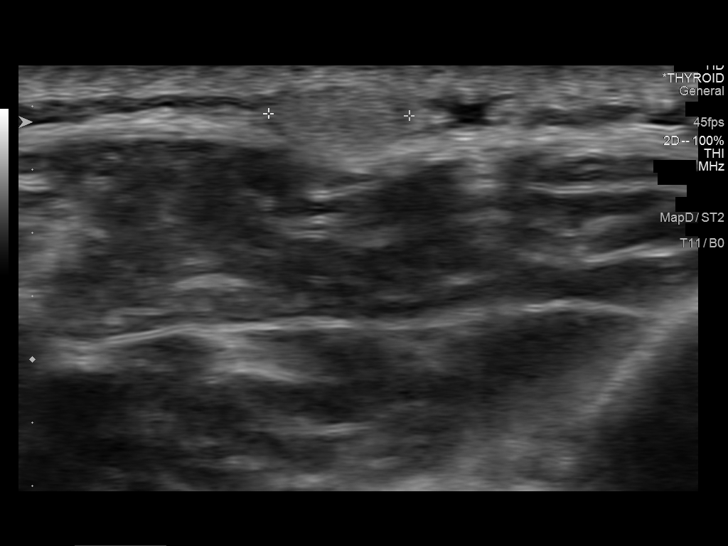
[im 8/17]
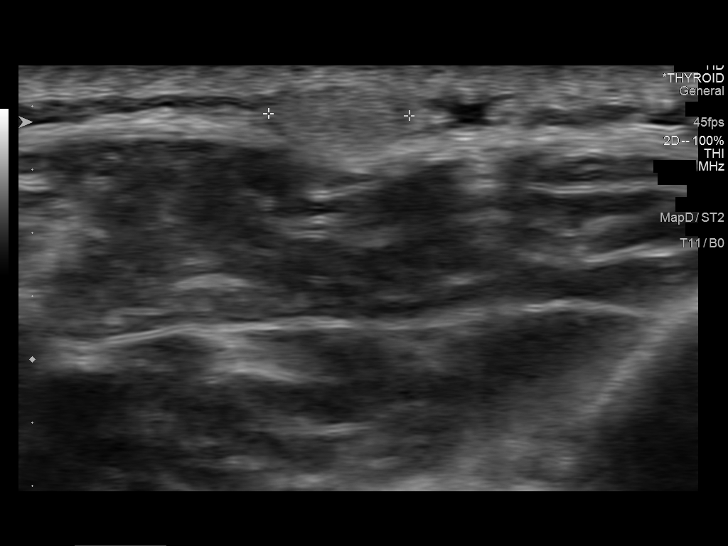
[im 9/17]
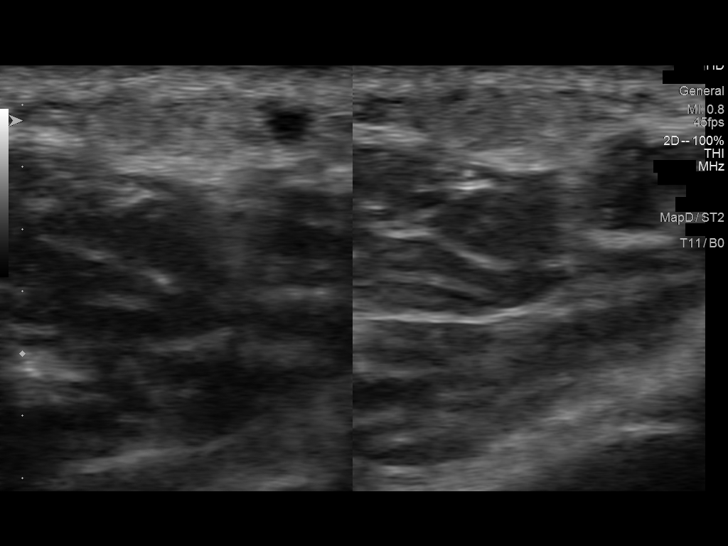
[im 10/17]
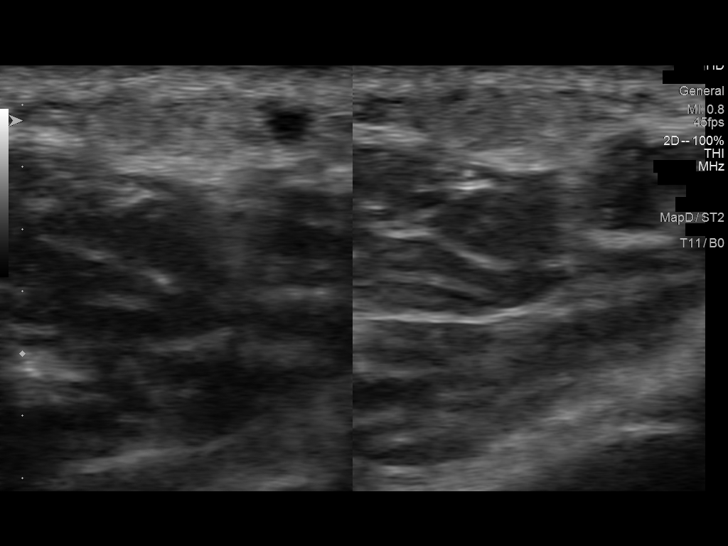
[im 11/17]
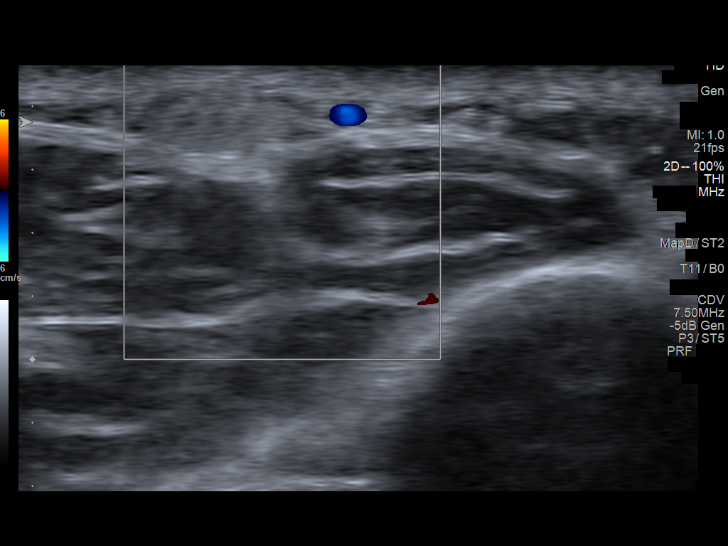
[im 13/17]
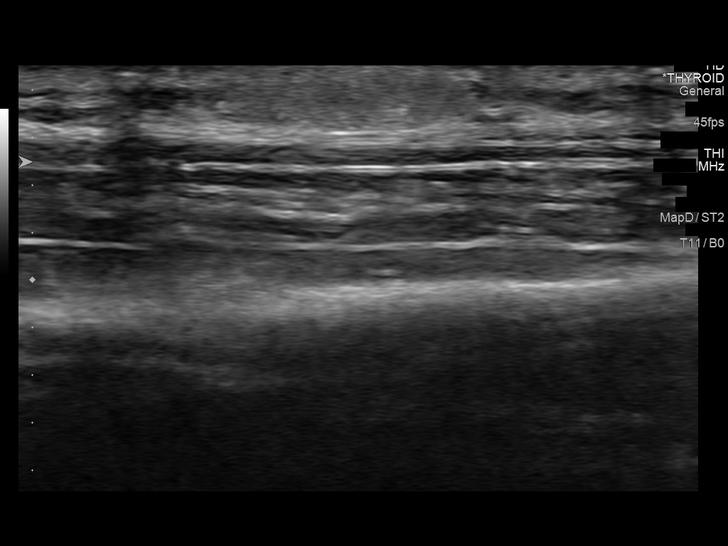
[im 14/17]
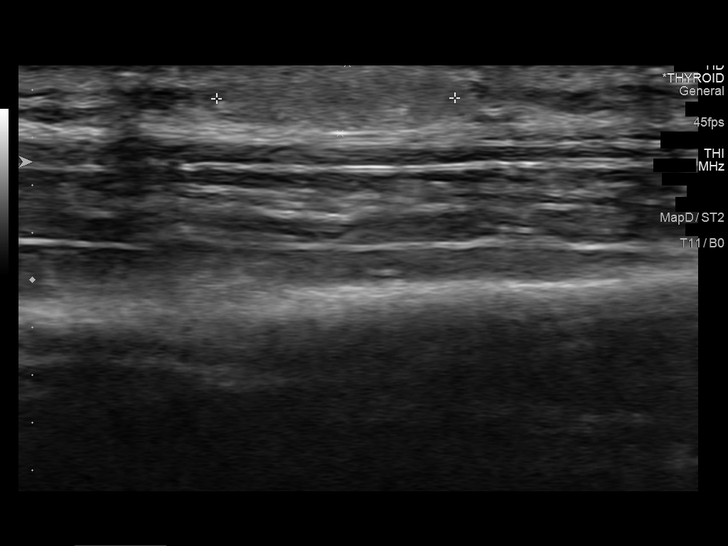
[im 15/17]
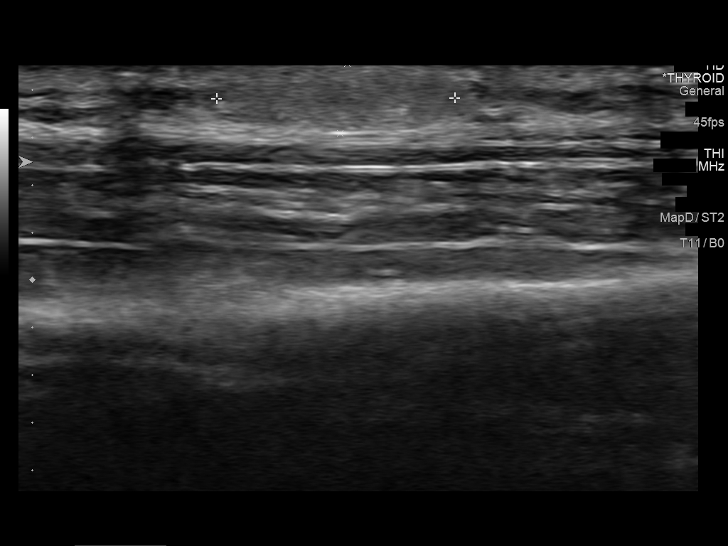
[im 17/17]
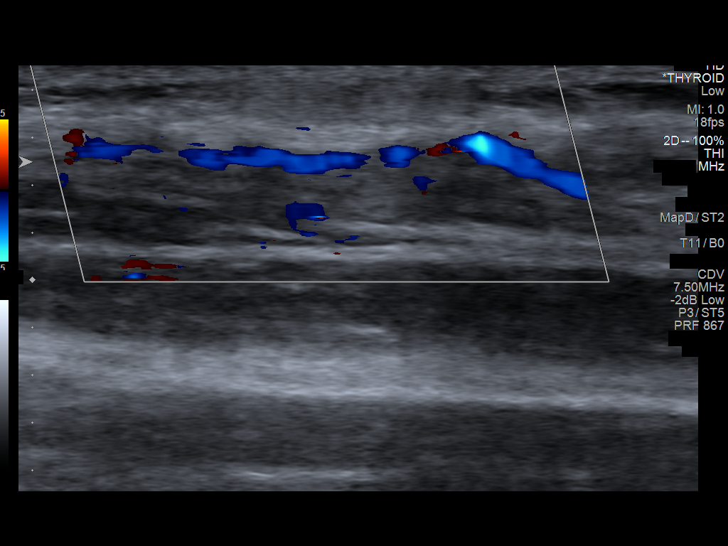

[14 of 16 positions shown; findings below may reference images not displayed]

FINDINGS: In the area of the palpable findings there is an ovoid isoechoic to
slightly hyperechoic nodule measuring 1 x 0.3 x 0.4 cm. There is a
nearby superficial vein. The lesion is not vascular.
IMPRESSION: There is an ovoid isodense slightly hyperechoic nodule in the
subcutaneous tissues at the site of clinical symptoms. There is no
visible hilar architecture. This could reflect a an atypical lymph
node, lipoma, neuroma, or fibroma.

MRI of the forearm is recommended.

## 2015-07-12 ENCOUNTER — Ambulatory Visit
Admission: RE | Admit: 2015-07-12 | Discharge: 2015-07-12 | Disposition: A | Discharge status: Home / Self Care | Payer: 59 | Source: Ambulatory Visit | Attending: Family Medicine | Admitting: Family Medicine

## 2015-07-12 DIAGNOSIS — Z1231 Encounter for screening mammogram for malignant neoplasm of breast: Secondary | ICD-10-CM

## 2015-09-14 ENCOUNTER — Other Ambulatory Visit: Payer: Self-pay | Admitting: Family Medicine

## 2015-11-15 ENCOUNTER — Ambulatory Visit (INDEPENDENT_AMBULATORY_CARE_PROVIDER_SITE_OTHER): Payer: 59 | Admitting: Physician Assistant

## 2015-11-15 ENCOUNTER — Encounter: Payer: Self-pay | Admitting: Physician Assistant

## 2015-11-15 VITALS — BP 118/82 | HR 89 | Temp 98.5°F | Wt 118.0 lb

## 2015-11-15 DIAGNOSIS — R0981 Nasal congestion: Secondary | ICD-10-CM | POA: Diagnosis not present

## 2015-11-15 DIAGNOSIS — J320 Chronic maxillary sinusitis: Secondary | ICD-10-CM | POA: Diagnosis not present

## 2015-11-15 MED ORDER — FLUCONAZOLE 150 MG PO TABS: 150.0000 mg | ORAL_TABLET | Freq: Once | ORAL | Status: DC

## 2015-11-15 MED ORDER — AMOXICILLIN-POT CLAVULANATE 875-125 MG PO TABS: 1.0000 | ORAL_TABLET | Freq: Two times a day (BID) | ORAL | Status: DC

## 2015-11-15 MED ORDER — FLUTICASONE PROPIONATE 50 MCG/ACT NA SUSP: 2.0000 | Freq: Every day | NASAL | Status: DC

## 2015-11-15 NOTE — Progress Notes (Signed)
   Subjective:    Patient ID: Joann Wilson, female    DOB: 02/26/1971, 45 y.o.   MRN: [id number]  HPI  Patient is a 45 year old female who presents to the clinic with right sided sinus pressure and upper tooth pain. Patient has had upper respiratory symptoms for the last week and a half but they have seemed to resolve in the last few days. All that remains is some sinus pressure and headache. She has tried Mucinex and Flonase with little relief. She denies any fever, chills, sore throat, ear pain, cough, shortness of breath or wheezing.    Review of Systems  All other systems reviewed and are negative.      Objective:   Physical Exam  Constitutional: She is oriented to person, place, and time. She appears well-developed and well-nourished.  HENT:  Head: Normocephalic and atraumatic.  Right Ear: External ear normal.  Left Ear: External ear normal.  Nose: Nose normal.  Mouth/Throat: Oropharynx is clear and moist. No oropharyngeal exudate.  Right sided maxillary sinus tender to palpation.  Bilateral nasal turbinates enlarged and pale.  Eyes: Conjunctivae are normal. Right eye exhibits no discharge. Left eye exhibits no discharge.  Neck: Normal range of motion. Neck supple.  Cardiovascular: Normal rate, regular rhythm and normal heart sounds.   Pulmonary/Chest: Effort normal and breath sounds normal. She has no wheezes.  Lymphadenopathy:    She has no cervical adenopathy.  Neurological: She is alert and oriented to person, place, and time.  Psychiatric: She has a normal mood and affect. Her behavior is normal.          Assessment & Plan:  Right maxillary sinusitis- treated with augmentin for 10 days. flonase refilled. Diflucan given to take if symptoms of yeast occur. Continue mucinex. HO given. Follow up as needed or not improving.

## 2015-11-15 NOTE — Patient Instructions (Signed)

## 2015-11-20 ENCOUNTER — Encounter: Payer: 59 | Admitting: Family Medicine

## 2015-11-22 ENCOUNTER — Ambulatory Visit (INDEPENDENT_AMBULATORY_CARE_PROVIDER_SITE_OTHER): Payer: 59 | Admitting: Family Medicine

## 2015-11-22 ENCOUNTER — Encounter: Payer: Self-pay | Admitting: Family Medicine

## 2015-11-22 ENCOUNTER — Other Ambulatory Visit (HOSPITAL_COMMUNITY)
Admission: RE | Admit: 2015-11-22 | Discharge: 2015-11-22 | Disposition: A | Discharge status: Home / Self Care | Payer: 59 | Source: Ambulatory Visit | Attending: Family Medicine | Admitting: Family Medicine

## 2015-11-22 VITALS — BP 92/60 | HR 74 | Ht 63.0 in | Wt 121.1 lb

## 2015-11-22 DIAGNOSIS — Z01419 Encounter for gynecological examination (general) (routine) without abnormal findings: Secondary | ICD-10-CM | POA: Diagnosis not present

## 2015-11-22 DIAGNOSIS — Z0189 Encounter for other specified special examinations: Secondary | ICD-10-CM | POA: Diagnosis not present

## 2015-11-22 DIAGNOSIS — Z124 Encounter for screening for malignant neoplasm of cervix: Secondary | ICD-10-CM | POA: Diagnosis not present

## 2015-11-22 DIAGNOSIS — Z0000 Encounter for general adult medical examination without abnormal findings: Secondary | ICD-10-CM

## 2015-11-22 LAB — LIPID PANEL
Cholesterol: 131 mg/dL (ref 125–200)
HDL: 41 mg/dL — AB (ref >=46)
LDL CALC: 80 mg/dL (ref <130)
TRIGLYCERIDES: 49 mg/dL (ref <150)
Total CHOL/HDL Ratio: 3.2 Ratio (ref <=5.0)
VLDL: 10 mg/dL (ref <30)

## 2015-11-22 LAB — COMPLETE METABOLIC PANEL WITH GFR
ALBUMIN: 4.1 g/dL (ref 3.6–5.1)
ALK PHOS: 51 U/L (ref 33–115)
ALT: 15 U/L (ref 6–29)
AST: 21 U/L (ref 10–35)
BILIRUBIN TOTAL: 0.4 mg/dL (ref 0.2–1.2)
BUN: 18 mg/dL (ref 7–25)
CO2: 27 mmol/L (ref 20–31)
CREATININE: 0.81 mg/dL (ref 0.50–1.10)
Calcium: 9.2 mg/dL (ref 8.6–10.2)
Chloride: 103 mmol/L (ref 98–110)
GFR, Est Non African American: 87 mL/min (ref >=60)
GLUCOSE: 75 mg/dL (ref 65–99)
Potassium: 4 mmol/L (ref 3.5–5.3)
Sodium: 137 mmol/L (ref 135–146)
TOTAL PROTEIN: 6.6 g/dL (ref 6.1–8.1)

## 2015-11-22 LAB — FERRITIN: Ferritin: 32 ng/mL (ref 10–232)

## 2015-11-22 LAB — PROGESTERONE: PROGESTERONE: 1.6 ng/mL

## 2015-11-22 LAB — TSH: TSH: 1.21 m[IU]/L

## 2015-11-22 NOTE — Progress Notes (Signed)
  Subjective:     Morning Halberg is a 45 y.o. female and is here for a comprehensive physical exam. The patient reports no problems.  Social History   Social History  . Marital Status: Married    Spouse Name: Onalee Hua  . Number of Children: 2  . Years of Education: college   Occupational History  . Homemaker    Social History Main Topics  . Smoking status: Never Smoker   . Smokeless tobacco: Not on file  . Alcohol Use: Yes     Comment: rarely  . Drug Use: No  . Sexual Activity:    Partners: Male    Birth Control/ Protection: Condom   Other Topics Concern  . Not on file   Social History Narrative   Does cardio workout 30-45 minutes 3 days per week. No caffeine intake.   Health Maintenance  Topic Date Due  . INFLUENZA VACCINE  05/14/2016  . PAP SMEAR  06/28/2016  . TETANUS/TDAP  04/08/2019  . HIV Screening  Completed    The following portions of the patient's history were reviewed and updated as appropriate: allergies, current medications, past family history, past medical history, past social history, past surgical history and problem list.  Review of Systems A comprehensive review of systems was negative.   Objective:    BP 92/60 mmHg  Pulse 74  Ht  (1.6 m)  Wt 121 lb 1.6 oz (54.931 kg)  BMI 21.46 kg/m2  SpO2 100% General appearance: alert, cooperative and appears stated age Head: Normocephalic, without obvious abnormality, atraumatic Eyes: conj clear, EOMI, PEERLA Ears: normal TM's and external ear canals both ears Nose: Nares normal. Septum midline. Mucosa normal. No drainage or sinus tenderness. Throat: lips, mucosa, and tongue normal; teeth and gums normal Neck: no adenopathy, no carotid bruit, no JVD, supple, symmetrical, trachea midline and thyroid not enlarged, symmetric, no tenderness/mass/nodules Back: symmetric, no curvature. ROM normal. No CVA tenderness. Lungs: clear to auscultation bilaterally Breasts: normal appearance, no masses or  tenderness Heart: regular rate and rhythm, S1, S2 normal, no murmur, click, rub or gallop Abdomen: soft, non-tender; bowel sounds normal; no masses,  no organomegaly Pelvic: cervix normal in appearance, external genitalia normal, no adnexal masses or tenderness, no cervical motion tenderness, rectovaginal septum normal, uterus normal size, shape, and consistency and vagina normal without discharge Extremities: extremities normal, atraumatic, no cyanosis or edema Pulses: 2+ and symmetric Skin: Skin color, texture, turgor normal. No rashes or lesions Lymph nodes: Cervical, supraclavicular, and axillary nodes normal. Neurologic: Alert and oriented X 3, normal strength and tone. Normal symmetric reflexes. Normal coordination and gait    Assessment:    Healthy female exam.     Plan:     See After Visit Summary for Counseling Recommendations  Keep up a regular exercise program and make sure you are eating a healthy diet Try to eat 4 servings of dairy a day, or if you are lactose intolerant take a calcium with vitamin D daily.  Your vaccines are up to date.

## 2015-11-22 NOTE — Patient Instructions (Signed)
Keep up a regular exercise program and make sure you are eating a healthy diet Try to eat 4 servings of dairy a day, or if you are lactose intolerant take a calcium with vitamin D daily.  Your vaccines are up to date.   

## 2015-11-23 ENCOUNTER — Other Ambulatory Visit: Payer: Self-pay

## 2015-11-23 ENCOUNTER — Other Ambulatory Visit: Payer: Self-pay | Admitting: *Deleted

## 2015-11-23 LAB — VITAMIN D 25 HYDROXY (VIT D DEFICIENCY, FRACTURES): VIT D 25 HYDROXY: 41 ng/mL (ref 30–100)

## 2015-11-23 MED ORDER — SYNTHROID 125 MCG PO TABS: ORAL_TABLET | ORAL | Status: DC

## 2015-11-24 LAB — CYTOLOGY - PAP

## 2015-11-29 LAB — ESTRADIOL, FREE
ESTRADIOL FREE: 1.61 pg/mL
Estradiol: 90 pg/mL

## 2015-12-06 ENCOUNTER — Other Ambulatory Visit: Payer: Self-pay | Admitting: *Deleted

## 2015-12-06 MED ORDER — SYNTHROID 125 MCG PO TABS: ORAL_TABLET | ORAL | Status: DC

## 2015-12-20 ENCOUNTER — Encounter: Payer: Self-pay | Admitting: Osteopathic Medicine

## 2015-12-20 ENCOUNTER — Ambulatory Visit (INDEPENDENT_AMBULATORY_CARE_PROVIDER_SITE_OTHER): Payer: 59 | Admitting: Osteopathic Medicine

## 2015-12-20 ENCOUNTER — Telehealth: Payer: Self-pay

## 2015-12-20 VITALS — BP 108/63 | HR 77 | Temp 98.3°F | Wt 121.0 lb

## 2015-12-20 DIAGNOSIS — B9689 Other specified bacterial agents as the cause of diseases classified elsewhere: Secondary | ICD-10-CM

## 2015-12-20 DIAGNOSIS — J019 Acute sinusitis, unspecified: Secondary | ICD-10-CM

## 2015-12-20 MED ORDER — AMOXICILLIN-POT CLAVULANATE 875-125 MG PO TABS: 1.0000 | ORAL_TABLET | Freq: Two times a day (BID) | ORAL | Status: DC

## 2015-12-20 NOTE — Telephone Encounter (Signed)
Patient was seen in office today and was given Agumentin. She only got a 5 day supply when she normally get a 10 day supply. Please clarify medication and resend to pharmacy. Rhonda Cunningham,CMA

## 2015-12-20 NOTE — Progress Notes (Signed)
HPI: Joann Wilson is a 45 y.o. female who [facility]  today for chief complaint of:  Chief Complaint  Patient presents with  . Sinus Problem   . Location: worse headache  . Quality: tickle in the throat, sore throat, then went to nasal congestion, chest congestion, was feeling better then painful in the forehead  . Assoc signs/symptoms: see ROS . Duration: >7 days . Modifying factors: has tried the following OTC/Rx medications: Mucinex, Flonase, Saline mist    Past medical, social and family history reviewed. Current medications and allergies reviewed.     Review of Systems: CONSTITUTIONAL: no fever/chills HEAD/EYES/EARS/NOSE/THROAT: yes headache, no vision change or hearing change, none now sore throat, (+) ear fullnes son R CARDIAC: No chest pain/pressure/palpitations RESPIRATORY: some but now occasional cough, no shortness of breath  Exam:  BP 108/63 mmHg  Pulse 77  Temp(Src) 98.3 F (36.8 C) (Oral)  Wt 121 lb (54.885 kg) Constitutional: VSS, see above. General Appearance: alert, well-developed, well-nourished, NAD Eyes: Normal lids and conjunctive, non-icteric sclera, Ears, Nose, Mouth, Throat: Normal external inspection ears/nares/mouth/lips/gums, normal TM, MMM;       posterior pharynx with erythema, without exudate, (+) TTP R frontal and maxillary sinuses Neck: No masses, trachea midline. normal lymph nodes Respiratory: Normal respiratory effort. No  wheeze/rhonchi/rales Cardiovascular: S1/S2 normal, no murmur/rub/gallop auscultated. RRR.   ASSESSMENT/PLAN: R frontal/maxillary sinusitis folowing resolved viral illness, abx as below, OTC meds discussed.  Acute bacterial sinusitis - Plan: amoxicillin-clavulanate (AUGMENTIN) 875-125 MG tablet    Return if symptoms worsen or fail to improve.

## 2015-12-20 NOTE — Telephone Encounter (Signed)
Newer studies suggest 5 days is typically a sufficient course of antibiotics, I usually instruct patients to call me if they are not feeling any better by the time these antibiotics are done, and at that time I will switch to a different medicine. Let me know if no better or if worse prior to the prescription running out.

## 2015-12-21 NOTE — Telephone Encounter (Signed)
Left detailed  Message on patient  prefered vm with information as noted below. Monte Bronder,CMA

## 2016-03-16 ENCOUNTER — Encounter: Payer: Self-pay | Admitting: Family Medicine

## 2016-03-20 ENCOUNTER — Other Ambulatory Visit: Payer: Self-pay | Admitting: Family Medicine

## 2016-04-09 ENCOUNTER — Encounter: Payer: Self-pay | Admitting: Family Medicine

## 2016-04-09 ENCOUNTER — Ambulatory Visit (INDEPENDENT_AMBULATORY_CARE_PROVIDER_SITE_OTHER): Payer: 59 | Admitting: Family Medicine

## 2016-04-09 VITALS — BP 120/69 | HR 76 | Wt 121.0 lb

## 2016-04-09 DIAGNOSIS — Z79890 Hormone replacement therapy (postmenopausal): Secondary | ICD-10-CM | POA: Diagnosis not present

## 2016-04-09 DIAGNOSIS — E038 Other specified hypothyroidism: Secondary | ICD-10-CM

## 2016-04-09 DIAGNOSIS — Z1231 Encounter for screening mammogram for malignant neoplasm of breast: Secondary | ICD-10-CM | POA: Diagnosis not present

## 2016-04-09 DIAGNOSIS — E559 Vitamin D deficiency, unspecified: Secondary | ICD-10-CM

## 2016-04-09 DIAGNOSIS — D509 Iron deficiency anemia, unspecified: Secondary | ICD-10-CM

## 2016-04-09 DIAGNOSIS — E611 Iron deficiency: Secondary | ICD-10-CM

## 2016-04-09 LAB — TSH: TSH: 2.42 m[IU]/L

## 2016-04-09 LAB — FERRITIN: FERRITIN: 39 ng/mL (ref 10–232)

## 2016-04-09 LAB — T4, FREE: FREE T4: 1.4 ng/dL (ref 0.8–1.8)

## 2016-04-09 NOTE — Progress Notes (Signed)
Subjective:    CC:   HPI: Hypothyroidism -  She has noticed some hair changes.  She feels like her hair has been more really and a little bit more brittle. She says that she's even considering no longer dying her hair.  She takes her medication regularly and does not miss doses. Next  She also had some strange rashes that seem to be triggered by the son. At first she thought it might be an allergic reaction to sunscreen. She did go see dermatology and they diagnosed her with sun sensitivity. She was getting a red itchy rash that would start after exposure. She also started a supplement called Helio care dietary supplement to take during the summer months. She did switch to a sunscreen that is dyed free and fragrance free and used it yesterday without any problems.  She also had light iron stores with a low ferritin with a last saw her. We are due to recheck that. Next  She also had borderline low vitamin D and has been taking supplementation.  Anxiety-she has had about 3 panic attacks since I last saw her. She says a couple of those were near her menstrual cycle. She was wondering if the rash that she was experiencing was first started back in March might actually be increasing her anxiety.    HRT - she still gets a regular menstrual cycle. Mammogram is up-to-date.  Past medical history, Surgical history, Family history not pertinant except as noted below, Social history, Allergies, and medications have been entered into the medical record, reviewed, and corrections made.   Review of Systems: No fevers, chills, night sweats, weight loss, chest pain, or shortness of breath.   Objective:    General: Well Developed, well nourished, and in no acute distress.  Neuro: Alert and oriented x3, extra-ocular muscles intact, sensation grossly intact.  HEENT: Normocephalic, atraumatic, no Thyromegaly.   Skin: Warm and dry, no rashes. Cardiac: Regular rate and rhythm, no murmurs rubs or gallops, no  lower extremity edema.  Respiratory: Clear to auscultation bilaterally. Not using accessory muscles, speaking in full sentences.   Impression and Recommendations:   Low iron-due to recheck levels. Next  Vitamin D deficiency-currently on supplementation. Recheck levels. 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 Continue with sunscreen and Helio care dietary supplement.  Anxiety-stable that she has had a recent flare and panic attacks. She is not interested in medication at this point in time. We'll continue to monitor.  Hypo-thyroidism-due to recheck TSH. Next  Hormone replacement therapy-we'll recheck hormone levels.

## 2016-04-10 ENCOUNTER — Encounter: Payer: Self-pay | Admitting: Family Medicine

## 2016-04-10 LAB — LUTEINIZING HORMONE: LH: 4.5 m[IU]/mL

## 2016-04-10 LAB — VITAMIN D 25 HYDROXY (VIT D DEFICIENCY, FRACTURES): VIT D 25 HYDROXY: 48 ng/mL (ref 30–100)

## 2016-04-10 LAB — IRON: IRON: 123 ug/dL (ref 40–190)

## 2016-04-10 LAB — PROGESTERONE: Progesterone: 2.4 ng/mL

## 2016-04-10 LAB — FOLLICLE STIMULATING HORMONE: FSH: 5.4 m[IU]/mL

## 2016-04-10 LAB — ESTRADIOL: Estradiol: 39 pg/mL

## 2016-04-29 ENCOUNTER — Ambulatory Visit: Payer: 59 | Admitting: Family Medicine

## 2016-05-01 ENCOUNTER — Ambulatory Visit: Payer: 59 | Admitting: Family Medicine

## 2016-05-20 ENCOUNTER — Other Ambulatory Visit: Payer: Self-pay | Admitting: Family Medicine

## 2016-05-21 ENCOUNTER — Ambulatory Visit: Payer: 59 | Admitting: Family Medicine

## 2016-07-12 ENCOUNTER — Ambulatory Visit
Admission: RE | Admit: 2016-07-12 | Discharge: 2016-07-12 | Disposition: A | Discharge status: Home / Self Care | Payer: 59 | Source: Ambulatory Visit | Attending: Family Medicine | Admitting: Family Medicine

## 2016-07-12 DIAGNOSIS — Z1231 Encounter for screening mammogram for malignant neoplasm of breast: Secondary | ICD-10-CM

## 2016-08-18 ENCOUNTER — Other Ambulatory Visit: Payer: Self-pay | Admitting: Family Medicine

## 2016-10-17 ENCOUNTER — Ambulatory Visit: Payer: 59 | Admitting: Family Medicine

## 2016-11-25 ENCOUNTER — Encounter: Payer: Self-pay | Admitting: Family Medicine

## 2016-11-25 ENCOUNTER — Ambulatory Visit (INDEPENDENT_AMBULATORY_CARE_PROVIDER_SITE_OTHER): Payer: 59 | Admitting: Family Medicine

## 2016-11-25 VITALS — BP 95/60 | HR 78 | Ht 63.0 in | Wt 115.0 lb

## 2016-11-25 DIAGNOSIS — M25511 Pain in right shoulder: Secondary | ICD-10-CM

## 2016-11-25 DIAGNOSIS — E611 Iron deficiency: Secondary | ICD-10-CM | POA: Diagnosis not present

## 2016-11-25 DIAGNOSIS — Z0000 Encounter for general adult medical examination without abnormal findings: Secondary | ICD-10-CM

## 2016-11-25 DIAGNOSIS — R2232 Localized swelling, mass and lump, left upper limb: Secondary | ICD-10-CM | POA: Diagnosis not present

## 2016-11-25 DIAGNOSIS — E038 Other specified hypothyroidism: Secondary | ICD-10-CM

## 2016-11-25 DIAGNOSIS — E559 Vitamin D deficiency, unspecified: Secondary | ICD-10-CM

## 2016-11-25 LAB — TSH: TSH: 1.12 mIU/L

## 2016-11-25 LAB — COMPLETE METABOLIC PANEL WITH GFR
ALK PHOS: 55 U/L (ref 33–115)
ALT: 15 U/L (ref 6–29)
AST: 21 U/L (ref 10–35)
Albumin: 4.3 g/dL (ref 3.6–5.1)
BILIRUBIN TOTAL: 0.7 mg/dL (ref 0.2–1.2)
BUN: 21 mg/dL (ref 7–25)
CO2: 26 mmol/L (ref 20–31)
Calcium: 9 mg/dL (ref 8.6–10.2)
Chloride: 106 mmol/L (ref 98–110)
Creat: 0.87 mg/dL (ref 0.50–1.10)
GFR, EST NON AFRICAN AMERICAN: 80 mL/min (ref >=60)
Glucose, Bld: 73 mg/dL (ref 65–99)
Potassium: 3.9 mmol/L (ref 3.5–5.3)
Sodium: 140 mmol/L (ref 135–146)
TOTAL PROTEIN: 6.9 g/dL (ref 6.1–8.1)

## 2016-11-25 LAB — CBC WITH DIFFERENTIAL/PLATELET
BASOS PCT: 0 %
Basophils Absolute: 0 cells/uL (ref 0–200)
Eosinophils Absolute: 152 cells/uL (ref 15–500)
Eosinophils Relative: 2 %
HEMATOCRIT: 38.6 % (ref 35.0–45.0)
Hemoglobin: 12.8 g/dL (ref 11.7–15.5)
LYMPHS ABS: 1672 {cells}/uL (ref 850–3900)
Lymphocytes Relative: 22 %
MCH: 30.6 pg (ref 27.0–33.0)
MCHC: 33.2 g/dL (ref 32.0–36.0)
MCV: 92.3 fL (ref 80.0–100.0)
MONO ABS: 532 {cells}/uL (ref 200–950)
MPV: 10.5 fL (ref 7.5–12.5)
Monocytes Relative: 7 %
NEUTROS ABS: 5244 {cells}/uL (ref 1500–7800)
Neutrophils Relative %: 69 %
Platelets: 209 10*3/uL (ref 140–400)
RBC: 4.18 MIL/uL (ref 3.80–5.10)
RDW: 13.4 % (ref 11.0–15.0)
WBC: 7.6 10*3/uL (ref 3.8–10.8)

## 2016-11-25 LAB — FERRITIN: Ferritin: 48 ng/mL (ref 10–232)

## 2016-11-25 LAB — LIPID PANEL W/REFLEX DIRECT LDL
CHOLESTEROL: 140 mg/dL (ref <200)
HDL: 50 mg/dL — ABNORMAL LOW (ref >50)
LDL-Cholesterol: 76 mg/dL
Non-HDL Cholesterol (Calc): 90 mg/dL (ref <130)
TRIGLYCERIDES: 49 mg/dL (ref <150)
Total CHOL/HDL Ratio: 2.8 Ratio (ref <5.0)

## 2016-11-25 NOTE — Progress Notes (Signed)
Subjective:     Joann Wilson is a 46 y.o. female and is here for a comprehensive physical exam. The patient reports problems - has noticed some scalp and hair changes.  shampoos that typically don't bother her seem to be building up on her scalp and she only seems to use free and clear shampoo's. She's noticed this change over the last year. More recently she's noticed a little bit more cold sensitivity. But she otherwise feels like her thyroid medication is working well. She would like to have her hormone levels checked again. She does follow yearly with a dermatologist.  Also had a small mild pain in her left shoulder. She said a couple weeks ago [place] Rocker[place]man been raising her arm when she was playing karate with her kids. She felt something sharp at that time it has been sore ever since then. She says it more painful if she sleeps on her opposite side it will almost all her shoulder and the uncomfortable. Plus [place] and was doing a lot of carrying and lifting of bags etc. Tried some ibuprofen but hasn't helped very much.  Social History   Social History  . Marital status: Married    Spouse name: Onalee HuaDavid  . Number of children: 2  . Years of education: college   Occupational History  . Homemaker    Social History Main Topics  . Smoking status: Never Smoker  . Smokeless tobacco: Never Used  . Alcohol use Yes     Comment: rarely  . Drug use: No  . Sexual activity: Yes    Partners: Male    Birth control/ protection: Condom   Other Topics Concern  . Not on file   Social History Narrative   Does cardio workout 30-45 minutes 3 days per week. No caffeine intake.   Health Maintenance  Topic Date Due  . PAP SMEAR  11/21/2018  . TETANUS/TDAP  04/08/2019  . INFLUENZA VACCINE  Completed  . HIV Screening  Completed    The following portions of the patient's history were reviewed and updated as appropriate: allergies, current medications, past family history, past  medical history, past social history, past surgical history and problem list.  Review of Systems A comprehensive review of systems was negative.   Objective:    BP 95/60   Pulse 78   Ht 5\' 3"  (1.6 m)   Wt 115 lb (52.2 kg)   SpO2 100%   BMI 20.37 kg/m   General appearance: alert, cooperative and appears stated age Head: Normocephalic, without obvious abnormality, atraumatic Eyes: conj clear, EOMI, PEERLA Ears: normal TM's and external ear canals both ears Nose: Nares normal. Septum midline. Mucosa normal. No drainage or sinus tenderness. Throat: lips, mucosa, and tongue normal; teeth and gums normal Neck: no adenopathy, no carotid bruit, no JVD, supple, symmetrical, trachea midline and thyroid not enlarged, symmetric, no tenderness/mass/nodules Back: symmetric, no curvature. ROM normal. No CVA tenderness. Lungs: clear to auscultation bilaterally Breasts: normal appearance, no masses or tenderness Heart: regular rate and rhythm, S1, S2 normal, no murmur, click, rub or gallop Abdomen: soft, non-tender; bowel sounds normal; no masses,  no organomegaly Extremities: extremities normal, atraumatic, no cyanosis or edema Pulses: 2+ and symmetric Skin: Skin color, texture, turgor normal. No rashes or lesions Lymph nodes: Cervical, supraclavicular, and axillary nodes normal. Neurologic: Alert and oriented X 3, normal strength and tone. Normal symmetric reflexes. Normal coordination and gait    Left shoulder with normal range of  motion. Strength is 5 out of 5 in all directions. She is slightly weaker with lift off on the left arm compared to the right arm.   Assessment:    Healthy female exam.      Plan:     See After Visit Summary for Counseling Recommendations   Keep up a regular exercise program and make sure you are eating a healthy diet Try to eat 4 servings of dairy a day, or if you are lactose intolerant take a calcium with vitamin D daily.  Your vaccines are up to date.    Hypothyroidism-due to recheck thyroid levels. Call with results once available. We'll also do some hormone testing. Will call with results.  Right shoulder pain/suspect right rotator cuff injury. Given handout with exercises to do on her own at home. If not improving then we can get her in with one of our sports medicine docs.

## 2016-11-25 NOTE — Patient Instructions (Signed)
Keep up a regular exercise program and make sure you are eating a healthy diet Try to eat 4 servings of dairy a day, or if you are lactose intolerant take a calcium with vitamin D daily.  Your vaccines are up to date.   

## 2016-11-26 LAB — ESTRADIOL: Estradiol: 38 pg/mL

## 2016-11-26 LAB — VITAMIN D 25 HYDROXY (VIT D DEFICIENCY, FRACTURES): Vit D, 25-Hydroxy: 42 ng/mL (ref 30–100)

## 2016-11-26 LAB — FOLLICLE STIMULATING HORMONE: FSH: 9.7 m[IU]/mL

## 2016-11-26 LAB — LUTEINIZING HORMONE: LH: 5.2 m[IU]/mL

## 2016-11-26 LAB — PROGESTERONE: Progesterone: 0.8 ng/mL

## 2016-11-26 NOTE — Progress Notes (Signed)
All labs are normal. 

## 2016-12-03 ENCOUNTER — Encounter: Payer: 59 | Admitting: Family Medicine

## 2016-12-15 ENCOUNTER — Other Ambulatory Visit: Payer: Self-pay | Admitting: Physician Assistant

## 2016-12-15 DIAGNOSIS — R0981 Nasal congestion: Secondary | ICD-10-CM

## 2016-12-23 ENCOUNTER — Encounter: Payer: Self-pay | Admitting: Family Medicine

## 2017-01-09 ENCOUNTER — Ambulatory Visit (INDEPENDENT_AMBULATORY_CARE_PROVIDER_SITE_OTHER): Payer: 59 | Admitting: Physician Assistant

## 2017-01-09 VITALS — BP 105/69 | HR 91 | Temp 98.6°F | Wt 116.0 lb

## 2017-01-09 DIAGNOSIS — J302 Other seasonal allergic rhinitis: Secondary | ICD-10-CM

## 2017-01-09 MED ORDER — LEVOCETIRIZINE DIHYDROCHLORIDE 5 MG PO TABS: 5.0000 mg | ORAL_TABLET | Freq: Every evening | ORAL | 3 refills | Status: DC

## 2017-01-09 NOTE — Progress Notes (Signed)
HPI:                                                                Joann Wilson is a 46 y.o. female who [facility] today for   Patient with PMH of allergic rhinitis, GAD, hypothyroidism, vitamin D deficiency presents with right-sided sinus pressure and rhinorrhea x 1 day. Denies fever, chills, sore throat, cough, dyspnea or wheezing. Nonsmoker. No history of asthma or pulmonary disease. She has tried Claritin, Flonase, and Mucinex-D.   Past Medical History:  Diagnosis Date  . Fibrocystic breast   . History of anxiety    Past Surgical History:  Procedure Laterality Date  . NO PAST SURGERIES     Social History  Substance Use Topics  . Smoking status: Never Smoker  . Smokeless tobacco: Never Used  . Alcohol use Yes     Comment: rarely   family history includes Diabetes in her father and mother; Heart attack (age of onset: 8155) in her father and maternal grandmother; Hyperlipidemia in her father and mother; Hypertension in her father and mother; Migraines in her mother.  ROS: negative except as noted in the HPI  Medications: Current Outpatient Prescriptions  Medication Sig Dispense Refill  . AMBULATORY NON FORMULARY MEDICATION Medication Name: testosterone 1mg /0.795ml.  Apply 0.415ml once daily 30 mL 6  . AMBULATORY NON FORMULARY MEDICATION Medication Name: progesterone 50 mg per 0.5 mL cream. Apply 0.25 mL at bedtime. 30 mL 6  . AMBULATORY NON FORMULARY MEDICATION Medication Name: Helio care    . cholecalciferol (VITAMIN D) 1000 units tablet Take 1,000 Units by mouth daily.    . fluticasone (FLONASE) 50 MCG/ACT nasal spray USE 2 SPRAYS IN EACH NOSTRIL DAILY 16 g 0  . IRON PO Take by mouth daily.    . Magnesium 250 MG TABS Take 1 tablet by mouth at bedtime.    . Multiple Vitamins-Minerals (MULTIVITAMIN WITH MINERALS) tablet Take 1 tablet by mouth daily.    Marland Kitchen. SYNTHROID 125 MCG tablet TAKE 1 TABLET DAILY BEFORE BREAKFAST 90  tablet 1   No current facility-administered medications for this visit.    Allergies  Allergen Reactions  . Citalopram Other (See Comments)    Felt numb all over   . Fluoxetine Other (See Comments)    Insomnia  . Zoloft [Sertraline Hcl] Other (See Comments)    Felt numb all over       Objective:  BP 105/69   Pulse 91   Temp 98.6 F (37 C) (Oral)   Wt 116 lb (52.6 kg)   BMI 20.55 kg/m  Gen: well-groomed, cooperative, not ill-appearing, no distress HEENT: normal conjunctiva, TM's clear, nasal mucosa pink, oropharynx clear, moist mucus membranes, no frontal or maxillary sinus tenderness Pulm: Normal work of breathing, normal phonation, clear to auscultation bilaterally, no wheezes, rales or rhonchi CV: Normal rate, regular rhythm, s1 and s2 distinct, no murmurs, clicks or rubs  GI: soft, nondistended, nontender Neuro: alert and oriented x 3, EOM's intact Lymph: no cervical or tonsillar adenopathy Skin: warm and dry, no rashes or lesions on exposed skin, no cyanosis   No results found for this or any previous visit (from the past 72 hour(s)). No results found.    Assessment  and Plan: 46 y.o. female with   1. Acute seasonal allergic rhinitis, unspecified trigger - d/c Loratadine and start Xyzal antihistamine nightly - Cont Flonase, 1-2 sprays in each nostril daily - reviewed proper technique - Cont Mucinex-D with at least 8 ounces of water - Tylenol 500mg  every 6 hours as needed for headache/facial pressure - levocetirizine (XYZAL) 5 MG tablet; Take 1 tablet (5 mg total) by mouth every evening.  Dispense: 30 tablet; Refill: 3  Patient education and anticipatory guidance given Patient agrees with treatment plan Follow-up as needed if symptoms worsen or fail to improve  Levonne Hubert PA-C

## 2017-01-09 NOTE — Patient Instructions (Addendum)
-   Take Xyzal antihistamine nightly - Cont Flonase, 1-2 sprays in each nostril daily - Cont Mucinex-D with at least 8 ounces of water - Tylenol 500mg  every 6 hours as needed for headache/facial pressure  To use your nasal spray: - tilt head back - block one nostril - aim away from your nasal septum - dispense and gently inhale (DO NOT SNIFF) - wait 5 seconds - repeat on other side  Allergic Rhinitis Allergic rhinitis is when the mucous membranes in the nose respond to allergens. Allergens are particles in the air that cause your body to have an allergic reaction. This causes you to release allergic antibodies. Through a chain of events, these eventually cause you to release histamine into the blood stream. Although meant to protect the body, it is this release of histamine that causes your discomfort, such as frequent sneezing, congestion, and an itchy, runny nose. What are the causes? Seasonal allergic rhinitis (hay fever) is caused by pollen allergens that may come from grasses, trees, and weeds. Year-round allergic rhinitis (perennial allergic rhinitis) is caused by allergens such as house dust mites, pet dander, and mold spores. What are the signs or symptoms?  Nasal stuffiness (congestion).  Itchy, runny nose with sneezing and tearing of the eyes. How is this diagnosed? Your health care provider can help you determine the allergen or allergens that trigger your symptoms. If you and your health care provider are unable to determine the allergen, skin or blood testing may be used. Your health care provider will diagnose your condition after taking your health history and performing a physical exam. Your health care provider may assess you for other related conditions, such as asthma, pink eye, or an ear infection. How is this treated? Allergic rhinitis does not have a cure, but it can be controlled by:  Medicines that block allergy symptoms. These may include allergy shots, nasal sprays,  and oral antihistamines.  Avoiding the allergen. Hay fever may often be treated with antihistamines in pill or nasal spray forms. Antihistamines block the effects of histamine. There are over-the-counter medicines that may help with nasal congestion and swelling around the eyes. Check with your health care provider before taking or giving this medicine. If avoiding the allergen or the medicine prescribed do not work, there are many new medicines your health care provider can prescribe. Stronger medicine may be used if initial measures are ineffective. Desensitizing injections can be used if medicine and avoidance does not work. Desensitization is when a patient is given ongoing shots until the body becomes less sensitive to the allergen. Make sure you follow up with your health care provider if problems continue. Follow these instructions at home: It is not possible to completely avoid allergens, but you can reduce your symptoms by taking steps to limit your exposure to them. It helps to know exactly what you are allergic to so that you can avoid your specific triggers. Contact a health care provider if:  You have a fever.  You develop a cough that does not stop easily (persistent).  You have shortness of breath.  You start wheezing.  Symptoms interfere with normal daily activities. This information is not intended to replace advice given to you by your health care provider. Make sure you discuss any questions you have with your health care provider. Document Released: 06/25/2001 Document Revised: 05/31/2016 Document Reviewed: 06/07/2013 Elsevier Interactive Patient Education  2017 [employer]

## 2017-01-27 ENCOUNTER — Other Ambulatory Visit: Payer: Self-pay | Admitting: Physician Assistant

## 2017-01-27 DIAGNOSIS — R0981 Nasal congestion: Secondary | ICD-10-CM

## 2017-02-28 ENCOUNTER — Other Ambulatory Visit: Payer: Self-pay | Admitting: Family Medicine

## 2017-05-26 ENCOUNTER — Ambulatory Visit: Payer: 59 | Admitting: Family Medicine

## 2017-06-06 ENCOUNTER — Other Ambulatory Visit: Payer: Self-pay | Admitting: Family Medicine

## 2017-06-06 DIAGNOSIS — Z1231 Encounter for screening mammogram for malignant neoplasm of breast: Secondary | ICD-10-CM

## 2017-06-10 ENCOUNTER — Ambulatory Visit: Payer: 59

## 2017-06-10 ENCOUNTER — Ambulatory Visit: Payer: 59 | Admitting: Family Medicine

## 2017-06-10 NOTE — Progress Notes (Deleted)
Subjective:    CC: thyroid, anxiety  HPI:  Hypothyroidism-no recent skin or hair changes. Taking medication as prescribed. In fact the last time I saw her in February we decided to add an extra 25 g weekly to her current regimen as her TSH was in the 2 range to tighten it down just a little bit.  GAD -   She was struggling with some low iron and low vitamin D levels the only recheck them in February they were coming up nicely.  Past medical history, Surgical history, Family history not pertinant except as noted below, Social history, Allergies, and medications have been entered into the medical record, reviewed, and corrections made.   Review of Systems: No fevers, chills, night sweats, weight loss, chest pain, or shortness of breath.   Objective:    General: Well Developed, well nourished, and in no acute distress.  Neuro: Alert and oriented x3, extra-ocular muscles intact, sensation grossly intact.  HEENT: Normocephalic, atraumatic  Skin: Warm and dry, no rashes. Cardiac: Regular rate and rhythm, no murmurs rubs or gallops, no lower extremity edema.  Respiratory: Clear to auscultation bilaterally. Not using accessory muscles, speaking in full sentences.   Impression and Recommendations:    Hypothyroidism-due to recheck TSH level today.  GAD - GAD 7 score of

## 2017-06-19 ENCOUNTER — Ambulatory Visit (INDEPENDENT_AMBULATORY_CARE_PROVIDER_SITE_OTHER): Payer: 59 | Admitting: Family Medicine

## 2017-06-19 ENCOUNTER — Encounter: Payer: Self-pay | Admitting: Family Medicine

## 2017-06-19 VITALS — BP 107/64 | HR 74 | Wt 116.0 lb

## 2017-06-19 DIAGNOSIS — E038 Other specified hypothyroidism: Secondary | ICD-10-CM

## 2017-06-19 DIAGNOSIS — Z23 Encounter for immunization: Secondary | ICD-10-CM

## 2017-06-19 DIAGNOSIS — E349 Endocrine disorder, unspecified: Secondary | ICD-10-CM | POA: Diagnosis not present

## 2017-06-19 NOTE — Progress Notes (Signed)
Subjective:    CC: Thyroid   HPI:  Hypothyroidism  - no recent skin or hair changes. No significant changes in weight. She reports that she is takingher medication Regularly.No significant skin or hair changes or weight cd her hair is the pool this summer. And she has a few little red bumps  Lab Results  Component Value Date   TSH 1.12 11/25/2016    her periods are still regular but they are coming more frequently. Still typically every 24-27 days. Previously they were coming every 28-30 days. Her menstrual cycle usually last about 4 days and is not heavy. She gets a typical signs including breast tenderness around the time of the month.  Past medical history, Surgical history, Family history not pertinant except as noted below, Social history, Allergies, and medications have been entered into the medical record, reviewed, and corrections made.   Review of Systems: No fevers, chills, night sweats, weight loss, chest pain, or shortness of breath.   Objective:    General: Well Developed, well nourished, and in no acute distress.  Neuro: Alert and oriented x3, extra-ocular muscles intact, sensation grossly intact.  HEENT: Normocephalic, atraumatic , no thyromegaly Skin: Warm and dry, no rashes. Cardiac: Regular rate and rhythm, no murmurs rubs or gallops, no lower extremity edema.  Respiratory: Clear to auscultation bilaterally. Not using accessory muscles, speaking in full sentences.   Impression and Recommendations:    Hypothyroidism - ue to recheck TSH.  Home hormone supplementation-doing well overall. We just checked her hormone levels back in February and she has not had any  With current regimen for now.

## 2017-06-24 ENCOUNTER — Encounter: Payer: Self-pay | Admitting: Family Medicine

## 2017-06-24 LAB — TSH: TSH: 0.99 mIU/L

## 2017-07-15 ENCOUNTER — Ambulatory Visit
Admission: RE | Admit: 2017-07-15 | Discharge: 2017-07-15 | Disposition: A | Discharge status: Home / Self Care | Payer: 59 | Source: Ambulatory Visit | Attending: Family Medicine | Admitting: Family Medicine

## 2017-07-15 DIAGNOSIS — Z1231 Encounter for screening mammogram for malignant neoplasm of breast: Secondary | ICD-10-CM

## 2017-09-10 ENCOUNTER — Other Ambulatory Visit: Payer: Self-pay | Admitting: Family Medicine

## 2017-11-26 ENCOUNTER — Other Ambulatory Visit: Payer: Self-pay | Admitting: *Deleted

## 2017-11-26 MED ORDER — AMBULATORY NON FORMULARY MEDICATION: 6 refills | Status: DC

## 2017-11-28 LAB — LIPID PANEL
Cholesterol: 150 (ref 0–200)
HDL: 54 (ref 35–70)
LDL CALC: 86
Triglycerides: 52 (ref 40–160)

## 2017-11-28 LAB — BASIC METABOLIC PANEL: Glucose: 79

## 2017-12-02 ENCOUNTER — Ambulatory Visit (INDEPENDENT_AMBULATORY_CARE_PROVIDER_SITE_OTHER): Payer: BLUE CROSS/BLUE SHIELD | Admitting: Family Medicine

## 2017-12-02 ENCOUNTER — Encounter: Payer: Self-pay | Admitting: Family Medicine

## 2017-12-02 VITALS — BP 104/71 | HR 76 | Ht 63.0 in | Wt 116.0 lb

## 2017-12-02 DIAGNOSIS — Z79890 Hormone replacement therapy (postmenopausal): Secondary | ICD-10-CM | POA: Diagnosis not present

## 2017-12-02 DIAGNOSIS — Z0000 Encounter for general adult medical examination without abnormal findings: Secondary | ICD-10-CM

## 2017-12-02 DIAGNOSIS — E038 Other specified hypothyroidism: Secondary | ICD-10-CM | POA: Diagnosis not present

## 2017-12-02 NOTE — Patient Instructions (Addendum)

## 2017-12-02 NOTE — Progress Notes (Signed)
Subjective:     Joann Wilson is a 47 y.o. female and is here for a comprehensive physical exam. The patient reports no problems.  Last Pap smear was normal in 2017.   It is up-to-date.  She would like to have her hormones tested again.  She forgot to apply her testosterone so we will have that done on a different day.  She did have a cholesterol screening done through her husband's work and brought in a copy of those labs.  Social History   Socioeconomic History  . Marital status: Married    Spouse name: Onalee HuaDavid  . Number of children: 2  . Years of education: college  . Highest education level: Not on file  Social Needs  . Financial resource strain: Not on file  . Food insecurity - worry: Not on file  . Food insecurity - inability: Not on file  . Transportation needs - medical: Not on file  . Transportation needs - non-medical: Not on file  Occupational History  . Occupation: Homemaker  Tobacco Use  . Smoking status: Never Smoker  . Smokeless tobacco: Never Used  Substance and Sexual Activity  . Alcohol use: Yes    Comment: rarely  . Drug use: No  . Sexual activity: Yes    Partners: Male    Birth control/protection: Condom  Other Topics Concern  . Not on file  Social History Narrative   Does cardio workout 30-45 minutes 3 days per week. No caffeine intake.   Health Maintenance  Topic Date Due  . PAP SMEAR  11/21/2018  . TETANUS/TDAP  04/08/2019  . INFLUENZA VACCINE  Completed  . HIV Screening  Completed    The following portions of the patient's history were reviewed and updated as appropriate: allergies, current medications, past family history, past medical history, past social history, past surgical history and problem list.  Review of Systems A comprehensive review of systems was negative.   Objective:    BP 104/71   Pulse 76   Ht 5\' 3"  (1.6 m)   Wt 116 lb (52.6 kg)   SpO2 100%   BMI 20.55 kg/m  General appearance: alert, cooperative and appears stated  age Head: Normocephalic, without obvious abnormality, atraumatic Eyes: conj clear, EOMI, PEERLA Ears: normal TM's and external ear canals both ears Nose: Nares normal. Septum midline. Mucosa normal. No drainage or sinus tenderness. Throat: lips, mucosa, and tongue normal; teeth and gums normal Neck: no adenopathy, no carotid bruit, no JVD, supple, symmetrical, trachea midline and thyroid not enlarged, symmetric, no tenderness/mass/nodules Back: symmetric, no curvature. ROM normal. No CVA tenderness. Lungs: clear to auscultation bilaterally Breasts: normal appearance, no masses or tenderness Heart: regular rate and rhythm, S1, S2 normal, no murmur, click, rub or gallop Abdomen: soft, non-tender; bowel sounds normal; no masses,  no organomegaly Extremities: extremities normal, atraumatic, no cyanosis or edema Pulses: 2+ and symmetric Skin: Skin color, texture, turgor normal. No rashes or lesions Lymph nodes: Cervical, supraclavicular, and axillary nodes normal. Neurologic: Alert and oriented X 3, normal strength and tone. Normal symmetric reflexes. Normal coordination and gait    Assessment:    Healthy female exam.      Plan:     See After Visit Summary for Counseling Recommendations   Keep up a regular exercise program and make sure you are eating a healthy diet Try to eat 4 servings of dairy a day, or if you are lactose intolerant take a calcium with vitamin D daily.  Your vaccines are  up to date.   HRT - check labs.  Continue compounded cream.    Hypothyroid - recheck TSH.  She is due for refill once gets her labs back.

## 2017-12-03 ENCOUNTER — Encounter: Payer: Self-pay | Admitting: Family Medicine

## 2017-12-03 LAB — COMPLETE METABOLIC PANEL WITH GFR
AG Ratio: 1.9 (calc) (ref 1.0–2.5)
ALBUMIN MSPROF: 4.5 g/dL (ref 3.6–5.1)
ALT: 17 U/L (ref 6–29)
AST: 21 U/L (ref 10–35)
Alkaline phosphatase (APISO): 52 U/L (ref 33–115)
BUN: 21 mg/dL (ref 7–25)
CALCIUM: 9.2 mg/dL (ref 8.6–10.2)
CO2: 25 mmol/L (ref 20–32)
CREATININE: 0.77 mg/dL (ref 0.50–1.10)
Chloride: 105 mmol/L (ref 98–110)
GFR, EST AFRICAN AMERICAN: 106 mL/min/{1.73_m2} (ref >=60)
GFR, EST NON AFRICAN AMERICAN: 91 mL/min/{1.73_m2} (ref >=60)
GLOBULIN: 2.4 g/dL (ref 1.9–3.7)
Glucose, Bld: 80 mg/dL (ref 65–99)
Potassium: 4.3 mmol/L (ref 3.5–5.3)
SODIUM: 137 mmol/L (ref 135–146)
TOTAL PROTEIN: 6.9 g/dL (ref 6.1–8.1)
Total Bilirubin: 0.6 mg/dL (ref 0.2–1.2)

## 2017-12-03 LAB — PROGESTERONE: Progesterone: 12.8 ng/mL

## 2017-12-03 LAB — LUTEINIZING HORMONE: LH: 5 m[IU]/mL

## 2017-12-03 LAB — CHG ASSAY OF BLOOD LIPOPROTEIN,VLDL CHOLEST
RATIO: 2.8
VLDL CHOLESTEROL CAL: 10

## 2017-12-03 LAB — CBC
HEMATOCRIT: 38.8 % (ref 35.0–45.0)
Hemoglobin: 13.1 g/dL (ref 11.7–15.5)
MCH: 30.6 pg (ref 27.0–33.0)
MCHC: 33.8 g/dL (ref 32.0–36.0)
MCV: 90.7 fL (ref 80.0–100.0)
MPV: 11 fL (ref 7.5–12.5)
Platelets: 233 10*3/uL (ref 140–400)
RBC: 4.28 10*6/uL (ref 3.80–5.10)
RDW: 12.2 % (ref 11.0–15.0)
WBC: 6.1 10*3/uL (ref 3.8–10.8)

## 2017-12-03 LAB — FOLLICLE STIMULATING HORMONE: FSH: 8.7 m[IU]/mL

## 2017-12-03 LAB — TSH: TSH: 1.47 mIU/L

## 2017-12-03 LAB — ESTRADIOL: Estradiol: 117 pg/mL

## 2017-12-03 LAB — VITAMIN D 25 HYDROXY (VIT D DEFICIENCY, FRACTURES): Vit D, 25-Hydroxy: 37 ng/mL (ref 30–100)

## 2017-12-04 ENCOUNTER — Encounter: Payer: Self-pay | Admitting: Family Medicine

## 2017-12-05 ENCOUNTER — Other Ambulatory Visit: Payer: Self-pay

## 2017-12-05 MED ORDER — SYNTHROID 125 MCG PO TABS: 125.0000 ug | ORAL_TABLET | Freq: Every day | ORAL | 2 refills | Status: DC

## 2017-12-08 LAB — TESTOSTERONE, TOTAL, LC/MS/MS: Testosterone, Total, LC-MS-MS: 27 ng/dL (ref 2–45)

## 2017-12-17 ENCOUNTER — Ambulatory Visit: Payer: 59 | Admitting: Family Medicine

## 2017-12-17 ENCOUNTER — Encounter: Payer: 59 | Admitting: Family Medicine

## 2017-12-26 ENCOUNTER — Other Ambulatory Visit: Payer: Self-pay | Admitting: Family Medicine

## 2017-12-26 DIAGNOSIS — R0981 Nasal congestion: Secondary | ICD-10-CM

## 2018-01-29 ENCOUNTER — Other Ambulatory Visit: Payer: Self-pay | Admitting: Family Medicine

## 2018-01-29 DIAGNOSIS — R0981 Nasal congestion: Secondary | ICD-10-CM

## 2018-02-10 ENCOUNTER — Encounter: Payer: Self-pay | Admitting: Family Medicine

## 2018-02-10 DIAGNOSIS — Z0184 Encounter for antibody response examination: Secondary | ICD-10-CM

## 2018-02-13 LAB — MEASLES/MUMPS/RUBELLA IMMUNITY
Mumps IgG: 127 AU/mL
RUBELLA: 1.61 {index}
RUBEOLA IGG: 39.2 [AU]/ml

## 2018-03-02 ENCOUNTER — Encounter: Payer: Self-pay | Admitting: Family Medicine

## 2018-03-02 ENCOUNTER — Other Ambulatory Visit: Payer: Self-pay | Admitting: *Deleted

## 2018-03-02 MED ORDER — AMBULATORY NON FORMULARY MEDICATION: 2.0000 mg | Freq: Every day | 6 refills | Status: DC

## 2018-03-02 MED ORDER — AMBULATORY NON FORMULARY MEDICATION: 6 refills | Status: DC

## 2018-03-18 ENCOUNTER — Telehealth: Payer: Self-pay | Admitting: Family Medicine

## 2018-03-18 NOTE — Telephone Encounter (Signed)
Pt called to advise her insurance did not cover her Vit D lab, or her CMP fully. Called [facility], added the following:  Other specified hypothyroidism  E03.8   Low iron  E61.1   Vitamin D deficiency  E55.9    Pt advised.

## 2018-05-18 ENCOUNTER — Encounter: Payer: Self-pay | Admitting: Family Medicine

## 2018-06-01 ENCOUNTER — Ambulatory Visit: Payer: BLUE CROSS/BLUE SHIELD | Admitting: Family Medicine

## 2018-06-09 ENCOUNTER — Ambulatory Visit (INDEPENDENT_AMBULATORY_CARE_PROVIDER_SITE_OTHER): Payer: BLUE CROSS/BLUE SHIELD | Admitting: Family Medicine

## 2018-06-09 ENCOUNTER — Encounter: Payer: Self-pay | Admitting: Family Medicine

## 2018-06-09 ENCOUNTER — Other Ambulatory Visit: Payer: Self-pay | Admitting: *Deleted

## 2018-06-09 VITALS — BP 111/61 | HR 77 | Ht 63.0 in | Wt 118.0 lb

## 2018-06-09 DIAGNOSIS — Z1231 Encounter for screening mammogram for malignant neoplasm of breast: Secondary | ICD-10-CM | POA: Diagnosis not present

## 2018-06-09 DIAGNOSIS — E038 Other specified hypothyroidism: Secondary | ICD-10-CM

## 2018-06-09 DIAGNOSIS — G43109 Migraine with aura, not intractable, without status migrainosus: Secondary | ICD-10-CM

## 2018-06-09 DIAGNOSIS — Z23 Encounter for immunization: Secondary | ICD-10-CM | POA: Diagnosis not present

## 2018-06-09 DIAGNOSIS — Z79890 Hormone replacement therapy (postmenopausal): Secondary | ICD-10-CM | POA: Diagnosis not present

## 2018-06-09 MED ORDER — RIZATRIPTAN BENZOATE 10 MG PO TBDP: 10.0000 mg | ORAL_TABLET | ORAL | 99 refills | Status: DC | PRN

## 2018-06-09 NOTE — Progress Notes (Signed)
Subjective:    CC: Check Thyroid   HPI:  Hypothyroidism - Taking medication regularly in the AM away from food and vitamins, etc. No recent change to skin, hair, or energy levels.  [place] but found out that we have a 3D machine downstairs now so would like to start getting her mammograms downstairs.  She also has been struggling with her migraines more.  Most years she only gets 2 to 3/year but since May she is had about 7 and 5 of those were concentrated mostly over the summer.  Most the time she takes ibuprofen.  She is to have a prescription for Maxalt but has been out of it for quite some time.  She would like a new prescription.  She is noticing that the migraines tend to cluster around her menstrual cycle.  Her periods have been every 23 to 28 days.  In the past before using hormone cream her periods were normally 30 to 35 days.  She did have one episode of breakthrough spotting this summer but only once.  Is been under a little more stress recently as her daughter just started middle school and she is been a little anxious and worried about it.  Stress does tend to trigger her migraines as well.   Past medical history, Surgical history, Family history not pertinant except as noted below, Social history, Allergies, and medications have been entered into the medical record, reviewed, and corrections made.   Review of Systems: No fevers, chills, night sweats, weight loss, chest pain, or shortness of breath.   Objective:    General: Well Developed, well nourished, and in no acute distress.  Neuro: Alert and oriented x3, extra-ocular muscles intact, sensation grossly intact.  HEENT: Normocephalic, atraumatic, no thyromegaly Skin: Warm and dry, no rashes. Cardiac: Regular rate and rhythm, no murmurs rubs or gallops, no lower extremity edema.  Respiratory: Clear to auscultation bilaterally. Not using accessory muscles, speaking  in full sentences.   Impression and Recommendations:    Hypothyroidism -due to recheck TSH.  Will adjust medication if needed.  Migraine headaches with aura-discussed options.  Will check hormone levels since it does seem to be fluctuating near the time of her menstrual cycle we will see if we may need to make some adjustments there.  Give her new prescription for Maxalt.  Need to refill Synthroid once we have her labs back.  HRT - due to recheck hormone levels and adjust med if needed.   Due for screening mammogram.  Order placed for [facility] [place] location.

## 2018-06-10 LAB — ESTRADIOL: Estradiol: 78 pg/mL

## 2018-06-10 LAB — LUTEINIZING HORMONE: LH: 4 m[IU]/mL

## 2018-06-10 LAB — PROGESTERONE: Progesterone: <0.5 ng/mL

## 2018-06-10 LAB — TSH: TSH: 1.66 m[IU]/L

## 2018-06-10 LAB — FOLLICLE STIMULATING HORMONE: FSH: 4.9 m[IU]/mL

## 2018-06-11 ENCOUNTER — Other Ambulatory Visit: Payer: Self-pay | Admitting: Family Medicine

## 2018-06-11 ENCOUNTER — Encounter: Payer: Self-pay | Admitting: Family Medicine

## 2018-06-11 DIAGNOSIS — R0981 Nasal congestion: Secondary | ICD-10-CM

## 2018-06-11 MED ORDER — SYNTHROID 125 MCG PO TABS: 125.0000 ug | ORAL_TABLET | Freq: Every day | ORAL | 2 refills | Status: DC

## 2018-07-15 ENCOUNTER — Ambulatory Visit (INDEPENDENT_AMBULATORY_CARE_PROVIDER_SITE_OTHER): Payer: BLUE CROSS/BLUE SHIELD

## 2018-07-15 DIAGNOSIS — Z1231 Encounter for screening mammogram for malignant neoplasm of breast: Secondary | ICD-10-CM | POA: Diagnosis not present

## 2018-08-17 ENCOUNTER — Other Ambulatory Visit: Payer: Self-pay | Admitting: *Deleted

## 2018-08-17 MED ORDER — AMBULATORY NON FORMULARY MEDICATION: 6 refills | Status: DC

## 2018-08-17 NOTE — Telephone Encounter (Signed)
Routing to pcp for signature.Yaretzi Ernandez [name], CMA  

## 2018-12-10 ENCOUNTER — Encounter: Payer: BLUE CROSS/BLUE SHIELD | Admitting: Family Medicine

## 2018-12-21 ENCOUNTER — Encounter: Payer: BLUE CROSS/BLUE SHIELD | Admitting: Family Medicine

## 2018-12-23 ENCOUNTER — Other Ambulatory Visit: Payer: Self-pay | Admitting: Family Medicine

## 2018-12-23 DIAGNOSIS — R0981 Nasal congestion: Secondary | ICD-10-CM

## 2018-12-27 ENCOUNTER — Encounter: Payer: Self-pay | Admitting: Family Medicine

## 2018-12-31 ENCOUNTER — Encounter: Payer: BLUE CROSS/BLUE SHIELD | Admitting: Family Medicine

## 2019-01-01 ENCOUNTER — Other Ambulatory Visit: Payer: Self-pay | Admitting: *Deleted

## 2019-01-18 ENCOUNTER — Encounter: Payer: Self-pay | Admitting: Family Medicine

## 2019-02-28 ENCOUNTER — Other Ambulatory Visit: Payer: Self-pay | Admitting: Family Medicine

## 2019-03-17 ENCOUNTER — Other Ambulatory Visit (HOSPITAL_COMMUNITY)
Admission: RE | Admit: 2019-03-17 | Discharge: 2019-03-17 | Disposition: A | Discharge status: Home / Self Care | Payer: BC Managed Care – PPO | Source: Ambulatory Visit | Attending: Family Medicine | Admitting: Family Medicine

## 2019-03-17 ENCOUNTER — Encounter: Payer: Self-pay | Admitting: Family Medicine

## 2019-03-17 ENCOUNTER — Ambulatory Visit (INDEPENDENT_AMBULATORY_CARE_PROVIDER_SITE_OTHER): Payer: BC Managed Care – PPO | Admitting: Family Medicine

## 2019-03-17 VITALS — BP 102/62 | HR 75 | Ht 63.0 in | Wt 125.0 lb

## 2019-03-17 DIAGNOSIS — Z124 Encounter for screening for malignant neoplasm of cervix: Secondary | ICD-10-CM | POA: Diagnosis not present

## 2019-03-17 DIAGNOSIS — Z0000 Encounter for general adult medical examination without abnormal findings: Secondary | ICD-10-CM | POA: Insufficient documentation

## 2019-03-17 DIAGNOSIS — D509 Iron deficiency anemia, unspecified: Secondary | ICD-10-CM

## 2019-03-17 DIAGNOSIS — Z79890 Hormone replacement therapy (postmenopausal): Secondary | ICD-10-CM

## 2019-03-17 DIAGNOSIS — Z23 Encounter for immunization: Secondary | ICD-10-CM

## 2019-03-17 DIAGNOSIS — E038 Other specified hypothyroidism: Secondary | ICD-10-CM

## 2019-03-17 NOTE — Progress Notes (Signed)
Subjective:     Joann Wilson is a 48 y.o. female and is here for a comprehensive physical exam. The patient reports no problems.  Hypothyroidism-she actually feels like she is doing really well and that her thyroid is well regulated.  Hormone replacement-she is on biologic hormones and feels like she is doing really well with her current regimen.  We had actually decreased her progesterone dose and says she has noticed improvement in her breast tenderness went away.  And her migraine headaches have been better.  She would like to have her iron rechecked she still taking an iron supplement.    Social History   Socioeconomic History  . Marital status: Married    Spouse name: Onalee Hua  . Number of children: 2  . Years of education: college  . Highest education level: Not on file  Occupational History  . Occupation: Futures trader  Social Needs  . Financial resource strain: Not on file  . Food insecurity:    Worry: Not on file    Inability: Not on file  . Transportation needs:    Medical: Not on file    Non-medical: Not on file  Tobacco Use  . Smoking status: Never Smoker  . Smokeless tobacco: Never Used  Substance and Sexual Activity  . Alcohol use: Yes    Comment: rarely  . Drug use: No  . Sexual activity: Yes    Partners: Male    Birth control/protection: Condom  Lifestyle  . Physical activity:    Days per week: Not on file    Minutes per session: Not on file  . Stress: Not on file  Relationships  . Social connections:    Talks on phone: Not on file    Gets together: Not on file    Attends religious service: Not on file    Active member of club or organization: Not on file    Attends meetings of clubs or organizations: Not on file    Relationship status: Not on file  . Intimate partner violence:    Fear of current or ex partner: Not on file    Emotionally abused: Not on file    Physically abused: Not on file    Forced sexual activity: Not on file  Other Topics Concern   . Not on file  Social History Narrative   Does cardio workout 30-45 minutes 3 days per week. No caffeine intake.   Health Maintenance  Topic Date Due  . PAP SMEAR-Modifier  11/21/2018  . TETANUS/TDAP  04/08/2019  . INFLUENZA VACCINE  05/15/2019  . HIV Screening  Completed    The following portions of the patient's history were reviewed and updated as appropriate: allergies, current medications, past family history, past medical history, past social history, past surgical history and problem list.  Review of Systems A comprehensive review of systems was negative.   Objective:    BP 102/62   Pulse 75   Ht 5\' 3"  (1.6 m)   Wt 125 lb (56.7 kg)   SpO2 100%   BMI 22.14 kg/m  General appearance: alert, cooperative and appears stated age Head: Normocephalic, without obvious abnormality, atraumatic Eyes: conj clear, EOMI, PEERLA Ears: normal TM's and external ear canals both ears Nose: Nares normal. Septum midline. Mucosa normal. No drainage or sinus tenderness. Throat: lips, mucosa, and tongue normal; teeth and gums normal Neck: no adenopathy, no carotid bruit, no JVD, supple, symmetrical, trachea midline and thyroid not enlarged, symmetric, no tenderness/mass/nodules Back: symmetric, no curvature. ROM normal.  No CVA tenderness. Lungs: clear to auscultation bilaterally Breasts: normal appearance, no masses or tenderness Heart: regular rate and rhythm, S1, S2 normal, no murmur, click, rub or gallop Abdomen: soft, non-tender; bowel sounds normal; no masses,  no organomegaly Pelvic: cervix normal in appearance, external genitalia normal, no adnexal masses or tenderness, no cervical motion tenderness, rectovaginal septum normal, uterus normal size, shape, and consistency and vagina normal without discharge Extremities: extremities normal, atraumatic, no cyanosis or edema Pulses: 2+ and symmetric Skin: Skin color, texture, turgor normal. No rashes or lesions Lymph nodes: Cervical,  supraclavicular, and axillary nodes normal. Neurologic: Alert and oriented X 3, normal strength and tone. Normal symmetric reflexes. Normal coordination and gait    Assessment:    Healthy female exam.      Plan:     See After Visit Summary for Counseling Recommendations    Keep up a regular exercise program and make sure you are eating a healthy diet Try to eat 4 servings of dairy a day, or if you are lactose intolerant take a calcium with vitamin D daily.  Your vaccines are up to date. Tdap given today.   HRT - will recheck her levels.    Iron def - due to recheck.   Hypothyroidism - recheck TSH. Will adjust dose if needed.

## 2019-03-20 LAB — IRON,TIBC AND FERRITIN PANEL
%SAT: 28 % (calc) (ref 16–45)
Ferritin: 31 ng/mL (ref 16–232)
Iron: 93 ug/dL (ref 40–190)
TIBC: 335 mcg/dL (calc) (ref 250–450)

## 2019-03-20 LAB — FOLLICLE STIMULATING HORMONE: FSH: 10.9 m[IU]/mL

## 2019-03-20 LAB — ESTRADIOL: Estradiol: 103 pg/mL

## 2019-03-20 LAB — TSH: TSH: 2.15 m[IU]/L

## 2019-03-20 LAB — LUTEINIZING HORMONE: LH: 8.8 m[IU]/mL

## 2019-03-20 LAB — PROGESTERONE: Progesterone: 2.4 ng/mL

## 2019-03-23 LAB — CYTOLOGY - PAP
Diagnosis: NEGATIVE
HPV: NOT DETECTED

## 2019-05-19 ENCOUNTER — Other Ambulatory Visit: Payer: Self-pay | Admitting: Neurology

## 2019-05-19 MED ORDER — AMBULATORY NON FORMULARY MEDICATION: 5 refills | Status: DC

## 2019-06-02 ENCOUNTER — Other Ambulatory Visit: Payer: Self-pay | Admitting: Nurse Practitioner

## 2019-06-02 DIAGNOSIS — Z1231 Encounter for screening mammogram for malignant neoplasm of breast: Secondary | ICD-10-CM

## 2019-06-24 ENCOUNTER — Encounter: Payer: Self-pay | Admitting: Family Medicine

## 2019-07-01 ENCOUNTER — Encounter: Payer: Self-pay | Admitting: Family Medicine

## 2019-07-01 NOTE — Telephone Encounter (Signed)
Ok to put in acute slot in person. Looks like skin is peeling.

## 2019-07-02 NOTE — Telephone Encounter (Signed)
Pt scheduled for Monday. She is going to do epson salt soaks throughout the weekend and if better she will send a message on Sunday if she needs to cancel appointment.

## 2019-07-05 ENCOUNTER — Encounter: Payer: Self-pay | Admitting: Family Medicine

## 2019-07-05 ENCOUNTER — Ambulatory Visit (INDEPENDENT_AMBULATORY_CARE_PROVIDER_SITE_OTHER): Payer: BC Managed Care – PPO | Admitting: Family Medicine

## 2019-07-05 ENCOUNTER — Other Ambulatory Visit: Payer: Self-pay

## 2019-07-05 VITALS — BP 120/75 | HR 78 | Ht 63.0 in | Wt 125.0 lb

## 2019-07-05 DIAGNOSIS — L600 Ingrowing nail: Secondary | ICD-10-CM | POA: Diagnosis not present

## 2019-07-05 NOTE — Patient Instructions (Signed)
Ingrown Toenail An ingrown toenail occurs when the corner or sides of a toenail grow into the surrounding skin. This causes discomfort and pain. The big toe is most commonly affected, but any of the toes can be affected. If an ingrown toenail is not treated, it can become infected. What are the causes? This condition may be caused by:  Wearing shoes that are too small or tight.  An injury, such as stubbing your toe or having your toe stepped on.  Improper cutting or care of your toenails.  Having nail or foot abnormalities that were present from birth (congenital abnormalities), such as having a nail that is too big for your toe. What increases the risk? The following factors may make you more likely to develop ingrown toenails:  Age. Nails tend to get thicker with age, so ingrown nails are more common among older people.  Cutting your toenails incorrectly, such as cutting them very short or cutting them unevenly. An ingrown toenail is more likely to get infected if you have:  Diabetes.  Blood flow (circulation) problems. What are the signs or symptoms? Symptoms of an ingrown toenail may include:  Pain, soreness, or tenderness.  Redness.  Swelling.  Hardening of the skin that surrounds the toenail. Signs that an ingrown toenail may be infected include:  Fluid or pus.  Symptoms that get worse instead of better. How is this diagnosed? An ingrown toenail may be diagnosed based on your medical history, your symptoms, and a physical exam. If you have fluid or blood coming from your toenail, a sample may be collected to test for the specific type of bacteria that is causing the infection. How is this treated? Treatment depends on how severe your ingrown toenail is. You may be able to care for your toenail at home.  If you have an infection, you may be prescribed antibiotic medicines.  If you have fluid or pus draining from your toenail, your health care provider may drain it.   If you have trouble walking, you may be given crutches to use.  If you have a severe or infected ingrown toenail, you may need a procedure to remove part or all of the nail. Follow these instructions at home: Foot care   Do not pick at your toenail or try to remove it yourself.  Soak your foot in warm, soapy water. Do this for 20 minutes, 3 times a day, or as often as told by your health care provider. This helps to keep your toe clean and keep your skin soft.  Wear shoes that fit well and are not too tight. Your health care provider may recommend that you wear open-toed shoes while you heal.  Trim your toenails regularly and carefully. Cut your toenails straight across to prevent injury to the skin at the corners of the toenail. Do not cut your nails in a curved shape.  Keep your feet clean and dry to help prevent infection. Medicines  Take over-the-counter and prescription medicines only as told by your health care provider.  If you were prescribed an antibiotic, take it as told by your health care provider. Do not stop taking the antibiotic even if you start to feel better. Activity  Return to your normal activities as told by your health care provider. Ask your health care provider what activities are safe for you.  Avoid activities that cause pain. General instructions  If your health care provider told you to use crutches to help you move around, use them   as instructed.  Keep all follow-up visits as told by your health care provider. This is important. Contact a health care provider if:  You have more redness, swelling, pain, or other symptoms that do not improve with treatment.  You have fluid, blood, or pus coming from your toenail. Get help right away if:  You have a red streak on your skin that starts at your foot and spreads up your leg.  You have a fever. Summary  An ingrown toenail occurs when the corner or sides of a toenail grow into the surrounding skin.  This causes discomfort and pain. The big toe is most commonly affected, but any of the toes can be affected.  If an ingrown toenail is not treated, it can become infected.  Fluid or pus draining from your toenail is a sign of infection. Your health care provider may need to drain it. You may be given antibiotics to treat the infection.  Trimming your toenails regularly and properly can help you prevent an ingrown toenail. This information is not intended to replace advice given to you by your health care provider. Make sure you discuss any questions you have with your health care provider. Document Released: 09/27/2000 Document Revised: 01/22/2019 Document Reviewed: 06/18/2017 Elsevier Patient Education  2020 [employer]  

## 2019-07-05 NOTE — Progress Notes (Signed)
Acute Office Visit  Subjective:    Patient ID: Joann Wilson, female    DOB: 05/30/1971, 48 y.o.   MRN: [id number]  Chief Complaint  Patient presents with  . Toe Pain    HPI Patient is in today for right great toe pain redness and swelling.  She actually contacted us via my chart on September 17.  She was worried that the nail might be ingrown.  Over the weekend she was doing some Epson salt soaks. She feels like it is better.  She never saw any drainage or pus but just wanted to make sure that it was okay and did not need any further treatment.  Past Medical History:  Diagnosis Date  . Fibrocystic breast   . History of anxiety     Past Surgical History:  Procedure Laterality Date  . NO PAST SURGERIES      Family History  Problem Relation Age of Onset  . Heart attack Maternal Grandmother 55  . Heart attack Father 1455  . Hyperlipidemia Mother   . Hyperlipidemia Father   . Hypertension Mother   . Hypertension Father   . Diabetes Mother   . Diabetes Father   . Migraines Mother     Social History   Socioeconomic History  . Marital status: Married    Spouse name: Onalee HuaDavid  . Number of children: 2  . Years of education: college  . Highest education level: Not on file  Occupational History  . Occupation: Futures traderHomemaker  Social Needs  . Financial resource strain: Not on file  . Food insecurity    Worry: Not on file    Inability: Not on file  . Transportation needs    Medical: Not on file    Non-medical: Not on file  Tobacco Use  . Smoking status: Never Smoker  . Smokeless tobacco: Never Used  Substance and Sexual Activity  . Alcohol use: Yes    Comment: rarely  . Drug use: No  . Sexual activity: Yes    Partners: Male    Birth control/protection: Condom  Lifestyle  . Physical activity    Days per week: Not on file    Minutes per session: Not on file  . Stress: Not on file  Relationships  . Social Musicianconnections    Talks on phone: Not on file    Gets together: Not  on file    Attends religious service: Not on file    Active member of club or organization: Not on file    Attends meetings of clubs or organizations: Not on file    Relationship status: Not on file  . Intimate partner violence    Fear of current or ex partner: Not on file    Emotionally abused: Not on file    Physically abused: Not on file    Forced sexual activity: Not on file  Other Topics Concern  . Not on file  Social History Narrative   Does cardio workout 30-45 minutes 3 days per week. No caffeine intake.    Outpatient Medications Prior to Visit  Medication Sig Dispense Refill  . AMBULATORY NON FORMULARY MEDICATION Medication Name: testosterone 1mg /0.755ml.  Apply 0.695ml once daily 30 mL 6  . AMBULATORY NON FORMULARY MEDICATION Medication Name: progesterone 50 mg per 0.5 mL cream. Apply 0.25 mL at bedtime. 30 mL 6  . AMBULATORY NON FORMULARY MEDICATION Medication Name: Helio care    . AMBULATORY NON FORMULARY MEDICATION Medication Name: progesterone 10% (100 mg/mL). Apply 0.25 mL (1  click) daily at bedtime.  Fax: [phone] 24 mL 6  . AMBULATORY NON FORMULARY MEDICATION Medication Name: Testosterone 2 mg/mL (0.2%) Cream. Apply 0.5 mL (2 clicks) ONCE daily as directed 45 mL 5  . cetirizine (ZYRTEC) 10 MG tablet Take 10 mg by mouth as needed for allergies.    . cholecalciferol (VITAMIN D) 1000 units tablet Take 1,000 Units by mouth daily.    . fluticasone (FLONASE) 50 MCG/ACT nasal spray SHAKE LIQUID AND USE 2 SPRAYS IN EACH NOSTRIL DAILY 16 g 4  . IRON PO Take by mouth daily.    Marland Kitchen levocetirizine (XYZAL) 5 MG tablet Take 1 tablet (5 mg total) by mouth every evening. 30 tablet 3  . Magnesium 250 MG TABS Take 1 tablet by mouth at bedtime.    . metroNIDAZOLE (METROGEL) 1 % gel Apply 1 application topically daily.    . Multiple Vitamins-Minerals (MULTIVITAMIN WITH MINERALS) tablet Take 1 tablet by mouth daily.    . rizatriptan (MAXALT-MLT) 10 MG disintegrating tablet Take 1 tablet  (10 mg total) by mouth as needed for migraine. May repeat in 2 hours if needed 10 tablet PRN  . SYNTHROID 125 MCG tablet TAKE 1 TABLET(125 MCG) BY MOUTH DAILY BEFORE BREAKFAST 90 tablet 2  . doxycycline (PERIOSTAT) 20 MG tablet Take 20 mg by mouth 2 (two) times daily.     No facility-administered medications prior to visit.     Allergies  Allergen Reactions  . Citalopram Other (See Comments)    Felt numb all over   . Fluoxetine Other (See Comments)    Insomnia  . Zoloft [Sertraline Hcl] Other (See Comments)    Felt numb all over    ROS     Objective:    Physical Exam  Constitutional: She is oriented to person, place, and time. She appears well-developed and well-nourished.  HENT:  Head: Normocephalic and atraumatic.  Eyes: Conjunctivae and EOM are normal.  Cardiovascular: Normal rate.  Pulmonary/Chest: Effort normal.  Neurological: She is alert and oriented to person, place, and time.  Skin: Skin is dry. No pallor.  Right great toenail medial border with some mild swelling, no significant erythema. No pus or bloody drainage.    Psychiatric: She has a normal mood and affect. Her behavior is normal.  Vitals reviewed.   BP 120/75   Pulse 78   Ht 5\' 3"  (1.6 m)   Wt 125 lb (56.7 kg)   SpO2 100%   BMI 22.14 kg/m  Wt Readings from Last 3 Encounters:  07/05/19 125 lb (56.7 kg)  03/17/19 125 lb (56.7 kg)  06/09/18 118 lb (53.5 kg)    There are no preventive care reminders to display for this patient.  There are no preventive care reminders to display for this patient.   Lab Results  Component Value Date   TSH 2.15 03/17/2019   Lab Results  Component Value Date   WBC 6.1 12/02/2017   HGB 13.1 12/02/2017   HCT 38.8 12/02/2017   MCV 90.7 12/02/2017   PLT 233 12/02/2017   Lab Results  Component Value Date   NA 137 12/02/2017   K 4.3 12/02/2017   CO2 25 12/02/2017   GLUCOSE 80 12/02/2017   BUN 21 12/02/2017   CREATININE 0.77 12/02/2017   BILITOT 0.6  12/02/2017   ALKPHOS 55 11/25/2016   AST 21 12/02/2017   ALT 17 12/02/2017   PROT 6.9 12/02/2017   ALBUMIN 4.3 11/25/2016   CALCIUM 9.2 12/02/2017   Lab Results  Component Value Date   CHOL 150 11/28/2017   Lab Results  Component Value Date   HDL 54 11/28/2017   Lab Results  Component Value Date   LDLCALC 86 11/28/2017   Lab Results  Component Value Date   TRIG 52 11/28/2017   Lab Results  Component Value Date   CHOLHDL 2.8 11/25/2016   No results found for: HGBA1C     Assessment & Plan:   Problem List Items Addressed This Visit    None    Visit Diagnoses    Ingrown nail of great toe of right foot    -  Primary     Ingrown nail-discussed options including continuing with her Epson salt soaks and then gently massaging the skin.  We also discussed a partial nail removal versus just numbing the lateral nail border and putting a little cotton underneath the edge.  For now since she is getting significant improvement with the soaks will just continue with that treatment if at any point its not completely resolving or it suddenly gets worse or she notices any bloody drainage or pus to please let us know immediately and we will have her in for procedure.   No orders of the defined types were placed in this encounter.    [name], MD

## 2019-07-07 ENCOUNTER — Encounter: Payer: Self-pay | Admitting: Family Medicine

## 2019-07-19 ENCOUNTER — Other Ambulatory Visit: Payer: Self-pay | Admitting: Family Medicine

## 2019-07-19 DIAGNOSIS — R0981 Nasal congestion: Secondary | ICD-10-CM

## 2019-08-04 ENCOUNTER — Other Ambulatory Visit: Payer: Self-pay

## 2019-08-04 ENCOUNTER — Ambulatory Visit (INDEPENDENT_AMBULATORY_CARE_PROVIDER_SITE_OTHER): Payer: BC Managed Care – PPO

## 2019-08-04 DIAGNOSIS — Z1231 Encounter for screening mammogram for malignant neoplasm of breast: Secondary | ICD-10-CM

## 2019-08-09 ENCOUNTER — Encounter: Payer: Self-pay | Admitting: Family Medicine

## 2019-08-17 ENCOUNTER — Ambulatory Visit (INDEPENDENT_AMBULATORY_CARE_PROVIDER_SITE_OTHER): Payer: BC Managed Care – PPO | Admitting: Family Medicine

## 2019-08-17 ENCOUNTER — Encounter: Payer: Self-pay | Admitting: Family Medicine

## 2019-08-17 ENCOUNTER — Other Ambulatory Visit: Payer: Self-pay

## 2019-08-17 VITALS — BP 100/69 | HR 73 | Ht 63.0 in | Wt 126.0 lb

## 2019-08-17 DIAGNOSIS — Z79890 Hormone replacement therapy (postmenopausal): Secondary | ICD-10-CM | POA: Diagnosis not present

## 2019-08-17 DIAGNOSIS — E038 Other specified hypothyroidism: Secondary | ICD-10-CM

## 2019-08-17 NOTE — Progress Notes (Signed)
Established Patient Office Visit  Subjective:  Patient ID: Joann Wilson, female    DOB: 1971/03/05  Age: 48 y.o. MRN: [id number]  CC:  Chief Complaint  Patient presents with  . Hypothyroidism    she would like to discuss her labs     HPI Joann Wilson presents for aloe up of recent lab work.  Hypothyroidism - Taking medication regularly in the AM away from food and vitamins, etc. No recent change to skin, hair, or energy levels.  Currently taking 125 mcg daily.  She is otherwise happy with her regimen.  F/U hormones -she is currently on topical hormone therapy including testosterone.  She still gets fairly regular.  Sometimes they are a little more heavy.  She has noticed a slight change but they are still very regular.  She feels like overall she is resting well.  This is been a little bit of a stressful year with the Covid pandemic and her children have been doing virtual learning.  She reports that she sleeps well with the combination of the progesterone and more recently started a supplement that has valerian root in it.  She also wanted to know about getting the shingles vaccine when she turns 49 in January as well as what to do for colon cancer screening.  Past Medical History:  Diagnosis Date  . Fibrocystic breast   . History of anxiety     Past Surgical History:  Procedure Laterality Date  . NO PAST SURGERIES      Family History  Problem Relation Age of Onset  . Heart attack Maternal Grandmother 55  . Heart attack Father 35  . Hyperlipidemia Father   . Hypertension Father   . Diabetes Father   . Hyperlipidemia Mother   . Hypertension Mother   . Diabetes Mother   . Migraines Mother     Social History   Socioeconomic History  . Marital status: Married    Spouse name: Onalee Hua  . Number of children: 2  . Years of education: college  . Highest education level: Not on file  Occupational History  . Occupation: Futures trader  Social Needs  . Financial resource strain:  Not on file  . Food insecurity    Worry: Not on file    Inability: Not on file  . Transportation needs    Medical: Not on file    Non-medical: Not on file  Tobacco Use  . Smoking status: Never Smoker  . Smokeless tobacco: Never Used  Substance and Sexual Activity  . Alcohol use: Yes    Comment: rarely  . Drug use: No  . Sexual activity: Yes    Partners: Male    Birth control/protection: Condom  Lifestyle  . Physical activity    Days per week: Not on file    Minutes per session: Not on file  . Stress: Not on file  Relationships  . Social Musician on phone: Not on file    Gets together: Not on file    Attends religious service: Not on file    Active member of club or organization: Not on file    Attends meetings of clubs or organizations: Not on file    Relationship status: Not on file  . Intimate partner violence    Fear of current or ex partner: Not on file    Emotionally abused: Not on file    Physically abused: Not on file    Forced sexual activity: Not on file  Other  Topics Concern  . Not on file  Social History Narrative   Does cardio workout 30-45 minutes 3 days per week. No caffeine intake.    Outpatient Medications Prior to Visit  Medication Sig Dispense Refill  . AMBULATORY NON FORMULARY MEDICATION Medication Name: testosterone 1mg /0.25ml.  Apply 0.1105ml once daily 30 mL 6  . AMBULATORY NON FORMULARY MEDICATION Medication Name: progesterone 50 mg per 0.5 mL cream. Apply 0.25 mL at bedtime. 30 mL 6  . AMBULATORY NON FORMULARY MEDICATION Medication Name: Helio care    . AMBULATORY NON FORMULARY MEDICATION Medication Name: progesterone 10% (100 mg/mL). Apply 0.25 mL (1 click) daily at bedtime.  Fax: [phone] 24 mL 6  . AMBULATORY NON FORMULARY MEDICATION Medication Name: Testosterone 2 mg/mL (0.2%) Cream. Apply 0.5 mL (2 clicks) ONCE daily as directed 45 mL 5  . cetirizine (ZYRTEC) 10 MG tablet Take 10 mg by mouth as needed for allergies.    .  cholecalciferol (VITAMIN D) 1000 units tablet Take 1,000 Units by mouth daily.    . fluticasone (FLONASE) 50 MCG/ACT nasal spray SHAKE AND INSTILL 2 SPRAYS IN EACH NOSTRIL EVERY DAY 16 g 4  . IRON PO Take by mouth daily.    Marland Kitchen. levocetirizine (XYZAL) 5 MG tablet Take 1 tablet (5 mg total) by mouth every evening. 30 tablet 3  . Magnesium 250 MG TABS Take 1 tablet by mouth at bedtime.    . metroNIDAZOLE (METROGEL) 1 % gel Apply 1 application topically daily.    . Multiple Vitamins-Minerals (MULTIVITAMIN WITH MINERALS) tablet Take 1 tablet by mouth daily.    . rizatriptan (MAXALT-MLT) 10 MG disintegrating tablet Take 1 tablet (10 mg total) by mouth as needed for migraine. May repeat in 2 hours if needed 10 tablet PRN  . SYNTHROID 125 MCG tablet TAKE 1 TABLET(125 MCG) BY MOUTH DAILY BEFORE BREAKFAST 90 tablet 2   No facility-administered medications prior to visit.     Allergies  Allergen Reactions  . Citalopram Other (See Comments)    Felt numb all over   . Fluoxetine Other (See Comments)    Insomnia  . Zoloft [Sertraline Hcl] Other (See Comments)    Felt numb all over    ROS Review of Systems    Objective:    Physical Exam  Constitutional: She is oriented to person, place, and time. She appears well-developed and well-nourished.  HENT:  Head: Normocephalic and atraumatic.  Cardiovascular: Normal rate, regular rhythm and normal heart sounds.  Pulmonary/Chest: Effort normal and breath sounds normal.  Neurological: She is alert and oriented to person, place, and time.  Skin: Skin is warm and dry.  Psychiatric: She has a normal mood and affect. Her behavior is normal.    BP 100/69   Pulse 73   Ht 5\' 3"  (1.6 m)   Wt 126 lb (57.2 kg)   SpO2 97%   BMI 22.32 kg/m  Wt Readings from Last 3 Encounters:  08/17/19 126 lb (57.2 kg)  07/05/19 125 lb (56.7 kg)  03/17/19 125 lb (56.7 kg)     There are no preventive care reminders to display for this patient.  There are no  preventive care reminders to display for this patient.  Lab Results  Component Value Date   TSH 2.15 03/17/2019   Lab Results  Component Value Date   WBC 6.1 12/02/2017   HGB 13.1 12/02/2017   HCT 38.8 12/02/2017   MCV 90.7 12/02/2017   PLT 233 12/02/2017   Lab Results  Component  Value Date   NA 137 12/02/2017   K 4.3 12/02/2017   CO2 25 12/02/2017   GLUCOSE 80 12/02/2017   BUN 21 12/02/2017   CREATININE 0.77 12/02/2017   BILITOT 0.6 12/02/2017   ALKPHOS 55 11/25/2016   AST 21 12/02/2017   ALT 17 12/02/2017   PROT 6.9 12/02/2017   ALBUMIN 4.3 11/25/2016   CALCIUM 9.2 12/02/2017   Lab Results  Component Value Date   CHOL 150 11/28/2017   Lab Results  Component Value Date   HDL 54 11/28/2017   Lab Results  Component Value Date   LDLCALC 86 11/28/2017   Lab Results  Component Value Date   TRIG 52 11/28/2017   Lab Results  Component Value Date   CHOLHDL 2.8 11/25/2016   No results found for: HGBA1C    Assessment & Plan:   Problem List Items Addressed This Visit      Endocrine   Hypothyroidism - Primary    TSH looks great.  Continue current regimen.  Plan to recheck TSH again in 6 to 12 months.  It is been really steady for quite a while so I think it certainly reasonable to check it again in 1 year or when I see her back in the spring either way is fine.        Other   Hormone replacement therapy (HRT)    Doing very well on current regimen of topical testosterone and progesterone.  She feels like it does work well.  Hormone levels look normal and reassuring and reviewed those today.  Plan to recheck in 1 year.         Discussed need for shingles vaccine when she turns 49.  Discussed the efficacy of the vaccine and that it does require 2.  We also discussed need for colon cancer screening when she turns 63.  She can either let me know in January.  She said she will likely 1 ago where her husband went in [place].  Or we can discuss again at her  follow-up in the spring.  No orders of the defined types were placed in this encounter.   Follow-up: Return in about 6 months (around 02/14/2020) for Physical with labs. Beatrice Lecher, MD

## 2019-08-18 ENCOUNTER — Encounter: Payer: Self-pay | Admitting: Family Medicine

## 2019-08-18 DIAGNOSIS — Z79890 Hormone replacement therapy (postmenopausal): Secondary | ICD-10-CM | POA: Insufficient documentation

## 2019-08-18 NOTE — Assessment & Plan Note (Signed)
TSH looks great.  Continue current regimen.  Plan to recheck TSH again in 6 to 12 months.  It is been really steady for quite a while so I think it certainly reasonable to check it again in 1 year or when I see her back in the spring either way is fine.

## 2019-08-18 NOTE — Assessment & Plan Note (Signed)
Doing very well on current regimen of topical testosterone and progesterone.  She feels like it does work well.  Hormone levels look normal and reassuring and reviewed those today.  Plan to recheck in 1 year.

## 2019-09-03 ENCOUNTER — Encounter: Payer: Self-pay | Admitting: Family Medicine

## 2019-11-26 ENCOUNTER — Other Ambulatory Visit: Payer: Self-pay | Admitting: Family Medicine

## 2020-01-28 ENCOUNTER — Telehealth: Payer: Self-pay

## 2020-01-28 MED ORDER — AMBULATORY NON FORMULARY MEDICATION: 5 refills | Status: DC

## 2020-01-28 NOTE — Telephone Encounter (Signed)
Joann Wilson states the pharmacy should have sent a fax for a refill on Testosterone.

## 2020-01-28 NOTE — Telephone Encounter (Signed)
I called the pharmacy and confirmed the prescription. Printed and placed in Dr [employer].

## 2020-01-28 NOTE — Telephone Encounter (Signed)
See other message

## 2020-01-28 NOTE — Telephone Encounter (Signed)
Pls have pharmacy refax.  I went through everything in my basket last night and I didn't see this.

## 2020-01-31 ENCOUNTER — Telehealth: Payer: Self-pay | Admitting: *Deleted

## 2020-01-31 ENCOUNTER — Encounter: Payer: Self-pay | Admitting: Family Medicine

## 2020-01-31 NOTE — Telephone Encounter (Signed)
rx sent to pharmacy.Peytyn Trine [name]  

## 2020-01-31 NOTE — Telephone Encounter (Signed)
Pt advised and she stated that they have the rx already.

## 2020-02-12 ENCOUNTER — Other Ambulatory Visit: Payer: Self-pay | Admitting: Family Medicine

## 2020-02-12 DIAGNOSIS — R0981 Nasal congestion: Secondary | ICD-10-CM

## 2020-02-16 ENCOUNTER — Encounter: Payer: Self-pay | Admitting: Family Medicine

## 2020-02-16 LAB — LIPID PANEL
Cholesterol: 168 (ref 0–200)
HDL: 50 (ref 35–70)
LDL Cholesterol: 109
LDl/HDL Ratio: 9
Triglycerides: 45 (ref 40–160)

## 2020-02-16 LAB — BASIC METABOLIC PANEL: Glucose: 75

## 2020-02-21 ENCOUNTER — Encounter: Payer: BC Managed Care – PPO | Admitting: Family Medicine

## 2020-02-24 ENCOUNTER — Other Ambulatory Visit: Payer: Self-pay

## 2020-02-24 ENCOUNTER — Ambulatory Visit (INDEPENDENT_AMBULATORY_CARE_PROVIDER_SITE_OTHER): Payer: BC Managed Care – PPO | Admitting: Family Medicine

## 2020-02-24 ENCOUNTER — Encounter: Payer: Self-pay | Admitting: Family Medicine

## 2020-02-24 VITALS — BP 106/76 | HR 80 | Ht 63.0 in | Wt 128.0 lb

## 2020-02-24 DIAGNOSIS — Z0000 Encounter for general adult medical examination without abnormal findings: Secondary | ICD-10-CM

## 2020-02-24 DIAGNOSIS — H6122 Impacted cerumen, left ear: Secondary | ICD-10-CM

## 2020-02-24 DIAGNOSIS — Z79890 Hormone replacement therapy (postmenopausal): Secondary | ICD-10-CM

## 2020-02-24 NOTE — Progress Notes (Signed)
Subjective:     Joann Wilson is a 49 y.o. female and is here for a comprehensive physical exam. The patient reports no problems.  Does have some occasional left breast tenderness usually around her menstrual cycle but does get her mammograms regularly.  She reports this year has been particularly stressful especially with the her kids a learning.  She has gained couple of pounds but says she and her husband actually just recently decided to restart their membership at the gym and so she is planning on getting back on track plus she knows this will help reduce some of her stress levels.  She also mentioned that sometimes she notices that she does not hear quite as well she will have to turn the volume up.  She does not think it significant hearing loss but just wanted to mention it.  Also been using a valerian root tea at night to help her relax and sleep well and this has been helpful.  Is now 49 and is due for colonoscopy.  She does plan on doing it but wants to wait until maybe later in the year or early next year.  Social History   Socioeconomic History  . Marital status: Married    Spouse name: Shanon Brow  . Number of children: 2  . Years of education: college  . Highest education level: Not on file  Occupational History  . Occupation: Homemaker  Tobacco Use  . Smoking status: Never Smoker  . Smokeless tobacco: Never Used  Substance and Sexual Activity  . Alcohol use: Yes    Comment: rarely  . Drug use: No  . Sexual activity: Yes    Partners: Male    Birth control/protection: Condom  Other Topics Concern  . Not on file  Social History Narrative   Does cardio workout 30-45 minutes 3 days per week. No caffeine intake.   Social Determinants of Health   Financial Resource Strain:   . Difficulty of Paying Living Expenses:   Food Insecurity:   . Worried About Charity fundraiser in the Last Year:   . Arboriculturist in the Last Year:   Transportation Needs:   . Lexicographer (Medical):   Marland Kitchen Lack of Transportation (Non-Medical):   Physical Activity:   . Days of Exercise per Week:   . Minutes of Exercise per Session:   Stress:   . Feeling of Stress :   Social Connections:   . Frequency of Communication with Friends and Family:   . Frequency of Social Gatherings with Friends and Family:   . Attends Religious Services:   . Active Member of Clubs or Organizations:   . Attends Archivist Meetings:   Marland Kitchen Marital Status:   Intimate Partner Violence:   . Fear of Current or Ex-Partner:   . Emotionally Abused:   Marland Kitchen Physically Abused:   . Sexually Abused:    Health Maintenance  Topic Date Due  . COLONOSCOPY  08/26/2020 (Originally 10/17/2019)  . INFLUENZA VACCINE  05/14/2020  . MAMMOGRAM  08/03/2021  . PAP SMEAR-Modifier  03/16/2024  . TETANUS/TDAP  03/16/2029  . COVID-19 Vaccine  Completed  . HIV Screening  Completed    The following portions of the patient's history were reviewed and updated as appropriate: allergies, current medications, past family history, past medical history, past social history, past surgical history and problem list.  Review of Systems A comprehensive review of systems was negative.   Objective:    BP 106/76  Pulse 80   Ht 5\' 3"  (1.6 m)   Wt 128 lb (58.1 kg)   SpO2 100%   BMI 22.67 kg/m  General appearance: alert, cooperative and appears stated age Head: Normocephalic, without obvious abnormality, atraumatic Eyes: conj clear, EOMi, PEERLA Ears: Right TM and canal is clear.  Left tympanic membrane is blocked by cerumen. Nose: Nares normal. Septum midline. Mucosa normal. No drainage or sinus tenderness. Throat: lips, mucosa, and tongue normal; teeth and gums normal Neck: no adenopathy, no carotid bruit, no JVD, supple, symmetrical, trachea midline and thyroid not enlarged, symmetric, no tenderness/mass/nodules Back: symmetric, no curvature. ROM normal. No CVA tenderness. Lungs: clear to auscultation  bilaterally Breasts: normal appearance, no masses or tenderness Heart: regular rate and rhythm, S1, S2 normal, no murmur, click, rub or gallop Abdomen: soft, non-tender; bowel sounds normal; no masses,  no organomegaly Extremities: extremities normal, atraumatic, no cyanosis or edema Pulses: 2+ and symmetric Skin: Skin color, texture, turgor normal. No rashes or lesions Lymph nodes: Cervical, supraclavicular, and axillary nodes normal. Neurologic: Alert and oriented X 3, normal strength and tone. Normal symmetric reflexes. Normal coordination and gait    Assessment:    Healthy female exam.      Plan:     See After Visit Summary for Counseling Recommendations   Keep up a regular exercise program and make sure you are eating a healthy diet Try to eat 4 servings of dairy a day, or if you are lactose intolerant take a calcium with vitamin D daily.  Your vaccines are up to date.  Wants to hold on shingles vaccination and colonoscopy for now but does plan to do it in the future.  Hearing loss-she did have cerumen blocking her left tympanic membrane so recommend Debrox drops.  We can also do a hearing screen at next visit if she would like.  Increase stress-discussed resources such as talk space etc. and/or referring her to a therapist/counselor if at any point she feels like that would be helpful.  HRT -due to recheck hormone levels today.  Not in a copy of a lipid panel done at work this month.

## 2020-02-24 NOTE — Patient Instructions (Addendum)
You could consider talk space which is a behavioral health app that offers counseling. Also recommend Debrox drops for your left ear.   Health Maintenance, Female Adopting a healthy lifestyle and getting preventive care are important in promoting health and wellness. Ask your health care provider about:  The right schedule for you to have regular tests and exams.  Things you can do on your own to prevent diseases and keep yourself healthy. What should I know about diet, weight, and exercise? Eat a healthy diet   Eat a diet that includes plenty of vegetables, fruits, low-fat dairy products, and lean protein.  Do not eat a lot of foods that are high in solid fats, added sugars, or sodium. Maintain a healthy weight Body mass index (BMI) is used to identify weight problems. It estimates body fat based on height and weight. Your health care provider can help determine your BMI and help you achieve or maintain a healthy weight. Get regular exercise Get regular exercise. This is one of the most important things you can do for your health. Most adults should:  Exercise for at least 150 minutes each week. The exercise should increase your heart rate and make you sweat (moderate-intensity exercise).  Do strengthening exercises at least twice a week. This is in addition to the moderate-intensity exercise.  Spend less time sitting. Even light physical activity can be beneficial. Watch cholesterol and blood lipids Have your blood tested for lipids and cholesterol at 50 years of age, then have this test every 5 years. Have your cholesterol levels checked more often if:  Your lipid or cholesterol levels are high.  You are older than 50 years of age.  You are at high risk for heart disease. What should I know about cancer screening? Depending on your health history and family history, you may need to have cancer screening at various ages. This may include screening for:  Breast  cancer.  Cervical cancer.  Colorectal cancer.  Skin cancer.  Lung cancer. What should I know about heart disease, diabetes, and high blood pressure? Blood pressure and heart disease  High blood pressure causes heart disease and increases the risk of stroke. This is more likely to develop in people who have high blood pressure readings, are of African descent, or are overweight.  Have your blood pressure checked: ? Every 3-5 years if you are 80-49 years of age. ? Every year if you are 75 years old or older. Diabetes Have regular diabetes screenings. This checks your fasting blood sugar level. Have the screening done:  Once every three years after age 60 if you are at a normal weight and have a low risk for diabetes.  More often and at a younger age if you are overweight or have a high risk for diabetes. What should I know about preventing infection? Hepatitis B If you have a higher risk for hepatitis B, you should be screened for this virus. Talk with your health care provider to find out if you are at risk for hepatitis B infection. Hepatitis C Testing is recommended for:  Everyone born from [birth year] through 1966.  Anyone with known risk factors for hepatitis C. Sexually transmitted infections (STIs)  Get screened for STIs, including gonorrhea and chlamydia, if: ? You are sexually active and are younger than 50 years of age. ? You are older than 50 years of age and your health care provider tells you that you are at risk for this type of infection. ? Your sexual  activity has changed since you were last screened, and you are at increased risk for chlamydia or gonorrhea. Ask your health care provider if you are at risk.  Ask your health care provider about whether you are at high risk for HIV. Your health care provider may recommend a prescription medicine to help prevent HIV infection. If you choose to take medicine to prevent HIV, you should first get tested for HIV. You should  then be tested every 3 months for as long as you are taking the medicine. Pregnancy  If you are about to stop having your period (premenopausal) and you may become pregnant, seek counseling before you get pregnant.  Take 400 to 800 micrograms (mcg) of folic acid every day if you become pregnant.  Ask for birth control (contraception) if you want to prevent pregnancy. Osteoporosis and menopause Osteoporosis is a disease in which the bones lose minerals and strength with aging. This can result in bone fractures. If you are 75 years old or older, or if you are at risk for osteoporosis and fractures, ask your health care provider if you should:  Be screened for bone loss.  Take a calcium or vitamin D supplement to lower your risk of fractures.  Be given hormone replacement therapy (HRT) to treat symptoms of menopause. Follow these instructions at home: Lifestyle  Do not use any products that contain nicotine or tobacco, such as cigarettes, e-cigarettes, and chewing tobacco. If you need help quitting, ask your health care provider.  Do not use street drugs.  Do not share needles.  Ask your health care provider for help if you need support or information about quitting drugs. Alcohol use  Do not drink alcohol if: ? Your health care provider tells you not to drink. ? You are pregnant, may be pregnant, or are planning to become pregnant.  If you drink alcohol: ? Limit how much you use to 0-1 drink a day. ? Limit intake if you are breastfeeding.  Be aware of how much alcohol is in your drink. In the [place] one drink equals one 12 oz bottle of beer (355 mL), one 5 oz glass of wine (148 mL), or one 1 oz glass of hard liquor (44 mL). General instructions  Schedule regular health, dental, and eye exams.  Stay current with your vaccines.  Tell your health care provider if: ? You often feel depressed. ? You have ever been abused or do not feel safe at home. Summary  Adopting a  healthy lifestyle and getting preventive care are important in promoting health and wellness.  Follow your health care provider's instructions about healthy diet, exercising, and getting tested or screened for diseases.  Follow your health care provider's instructions on monitoring your cholesterol and blood pressure. This information is not intended to replace advice given to you by your health care provider. Make sure you discuss any questions you have with your health care provider. Document Revised: 09/23/2018 Document Reviewed: 09/23/2018 Elsevier Patient Education  2020 [employer].

## 2020-02-25 LAB — COMPLETE METABOLIC PANEL WITH GFR
AG Ratio: 1.9 (calc) (ref 1.0–2.5)
ALT: 14 U/L (ref 6–29)
AST: 21 U/L (ref 10–35)
Albumin: 4.4 g/dL (ref 3.6–5.1)
Alkaline phosphatase (APISO): 57 U/L (ref 37–153)
BUN: 15 mg/dL (ref 7–25)
CO2: 29 mmol/L (ref 20–32)
Calcium: 9.1 mg/dL (ref 8.6–10.4)
Chloride: 104 mmol/L (ref 98–110)
Creat: 0.81 mg/dL (ref 0.50–1.05)
GFR, Est African American: 98 mL/min/{1.73_m2} (ref >=60)
GFR, Est Non African American: 85 mL/min/{1.73_m2} (ref >=60)
Globulin: 2.3 g/dL (calc) (ref 1.9–3.7)
Glucose, Bld: 79 mg/dL (ref 65–99)
Potassium: 4.2 mmol/L (ref 3.5–5.3)
Sodium: 138 mmol/L (ref 135–146)
Total Bilirubin: 0.6 mg/dL (ref 0.2–1.2)
Total Protein: 6.7 g/dL (ref 6.1–8.1)

## 2020-02-25 LAB — LUTEINIZING HORMONE: LH: 51.8 m[IU]/mL

## 2020-02-25 LAB — FOLLICLE STIMULATING HORMONE: FSH: 34 m[IU]/mL

## 2020-02-25 LAB — TSH: TSH: 1.65 mIU/L

## 2020-02-25 LAB — ESTRADIOL: Estradiol: 213 pg/mL

## 2020-02-25 LAB — PROGESTERONE: Progesterone: 1.1 ng/mL

## 2020-02-28 ENCOUNTER — Encounter: Payer: Self-pay | Admitting: Family Medicine

## 2020-02-29 ENCOUNTER — Telehealth: Payer: Self-pay

## 2020-02-29 NOTE — Telephone Encounter (Signed)
Joann Wilson called for test results. She wasn't sure if she can continue with current thyroid medication.

## 2020-03-06 ENCOUNTER — Encounter: Payer: BC Managed Care – PPO | Admitting: Family Medicine

## 2020-03-20 ENCOUNTER — Other Ambulatory Visit: Payer: Self-pay

## 2020-03-20 ENCOUNTER — Ambulatory Visit (INDEPENDENT_AMBULATORY_CARE_PROVIDER_SITE_OTHER): Payer: BC Managed Care – PPO | Admitting: Family Medicine

## 2020-03-20 ENCOUNTER — Encounter: Payer: Self-pay | Admitting: Family Medicine

## 2020-03-20 VITALS — BP 94/67 | HR 76 | Ht 63.0 in | Wt 128.0 lb

## 2020-03-20 DIAGNOSIS — H6122 Impacted cerumen, left ear: Secondary | ICD-10-CM

## 2020-03-20 DIAGNOSIS — R3129 Other microscopic hematuria: Secondary | ICD-10-CM | POA: Diagnosis not present

## 2020-03-20 DIAGNOSIS — N760 Acute vaginitis: Secondary | ICD-10-CM | POA: Diagnosis not present

## 2020-03-20 LAB — POCT URINALYSIS DIP (CLINITEK)
Bilirubin, UA: NEGATIVE
Glucose, UA: NEGATIVE mg/dL
Ketones, POC UA: NEGATIVE mg/dL
Leukocytes, UA: NEGATIVE
Nitrite, UA: NEGATIVE
POC PROTEIN,UA: NEGATIVE
Spec Grav, UA: 1.01 (ref 1.010–1.025)
Urobilinogen, UA: 0.2 E.U./dL
pH, UA: 7 (ref 5.0–8.0)

## 2020-03-20 LAB — WET PREP FOR TRICH, YEAST, CLUE
MICRO NUMBER:: 10560288
Specimen Quality: ADEQUATE

## 2020-03-20 NOTE — Progress Notes (Signed)
Acute Office Visit  Subjective:    Patient ID: Joann Wilson, female    DOB: 1971/08/10, 49 y.o.   MRN: [id number]  Chief Complaint  Patient presents with   Vaginitis    vaginal irritation, dull cramping    HPI Patient is in today for vaginal irritation and some slight itching.  After her last menstrual cycle on May 24th.  Dull pelvic ache. No vaginal d/c and no urinary sxs.   She also is 1 to take a peek in that left ear when she was here at the last office visit for wellness exam she had a cerumen blockage she has been using the eardrops to try to help remove the wax.  She wants me to see if it looks like it is better today.  Past Medical History:  Diagnosis Date   Fibrocystic breast    History of anxiety     Past Surgical History:  Procedure Laterality Date   NO PAST SURGERIES      Family History  Problem Relation Age of Onset   Heart attack Maternal Grandmother 19   Heart attack Father 32   Hyperlipidemia Father    Hypertension Father    Diabetes Father    Hyperlipidemia Mother    Hypertension Mother    Diabetes Mother    Migraines Mother     Social History   Socioeconomic History   Marital status: Married    Spouse name: Shanon Brow   Number of children: 2   Years of education: college   Highest education level: Not on file  Occupational History   Occupation: Homemaker  Tobacco Use   Smoking status: Never Smoker   Smokeless tobacco: Never Used  Substance and Sexual Activity   Alcohol use: Yes    Comment: rarely   Drug use: No   Sexual activity: Yes    Partners: Male    Birth control/protection: Condom  Other Topics Concern   Not on file  Social History Narrative   Does cardio workout 30-45 minutes 3 days per week. No caffeine intake.   Social Determinants of Health   Financial Resource Strain:    Difficulty of Paying Living Expenses:   Food Insecurity:    Worried About Charity fundraiser in the Last Year:    Academic librarian in the Last Year:   Transportation Needs:    Film/video editor (Medical):    Lack of Transportation (Non-Medical):   Physical Activity:    Days of Exercise per Week:    Minutes of Exercise per Session:   Stress:    Feeling of Stress :   Social Connections:    Frequency of Communication with Friends and Family:    Frequency of Social Gatherings with Friends and Family:    Attends Religious Services:    Active Member of Clubs or Organizations:    Attends Music therapist:    Marital Status:   Intimate Partner Violence:    Fear of Current or Ex-Partner:    Emotionally Abused:    Physically Abused:    Sexually Abused:     Outpatient Medications Prior to Visit  Medication Sig Dispense Refill   AMBULATORY NON FORMULARY MEDICATION Medication Name: testosterone 1mg /0.26ml.  Apply 0.57ml once daily 30 mL 6   AMBULATORY NON FORMULARY MEDICATION Medication Name: progesterone 50 mg per 0.5 mL cream. Apply 0.25 mL at bedtime. 30 mL 6   AMBULATORY NON FORMULARY MEDICATION Medication Name: [facility] care  AMBULATORY NON FORMULARY MEDICATION Medication Name: progesterone 10% (100 mg/mL). Apply 0.25 mL (1 click) daily at bedtime.  Fax: [phone] 24 mL 6   AMBULATORY NON FORMULARY MEDICATION Medication Name: Testosterone 2 mg/mL (0.2%) Cream. Apply 0.5 mL (2 clicks) ONCE daily as directed 45 mL 5   cetirizine (ZYRTEC) 10 MG tablet Take 10 mg by mouth as needed for allergies.     cholecalciferol (VITAMIN D) 1000 units tablet Take 1,000 Units by mouth daily.     fluticasone (FLONASE) 50 MCG/ACT nasal spray SHAKE AND INSTILL 2 SPRAYS IN EACH NOSTRIL EVERY DAY 16 g 4   IRON PO Take by mouth daily.     Magnesium 250 MG TABS Take 1 tablet by mouth at bedtime.     metroNIDAZOLE (METROGEL) 1 % gel Apply 1 application topically daily.     Multiple Vitamins-Minerals (MULTIVITAMIN WITH MINERALS) tablet Take 1 tablet by mouth daily.     rizatriptan  (MAXALT-MLT) 10 MG disintegrating tablet Take 1 tablet (10 mg total) by mouth as needed for migraine. May repeat in 2 hours if needed 10 tablet PRN   SOOLANTRA 1 % CREA APPLY THIN LAYER TO AFFECTED AREA ONCE DAILY AS NEEDED     SYNTHROID 125 MCG tablet TAKE 1 TABLET BY MOUTH DAILY BEFORE BREAKFAST 90 tablet 2   No facility-administered medications prior to visit.    Allergies  Allergen Reactions   Citalopram Other (See Comments)    Felt numb all over    Fluoxetine Other (See Comments)    Insomnia   Zoloft [Sertraline Hcl] Other (See Comments)    Felt numb all over    Review of Systems     Objective:    Physical Exam Vitals reviewed.  Constitutional:      Appearance: She is well-developed.  HENT:     Head: Normocephalic and atraumatic.     Comments: Left canal blocked by cerumen. Eyes:     Conjunctiva/sclera: Conjunctivae normal.  Cardiovascular:     Rate and Rhythm: Normal rate.  Pulmonary:     Effort: Pulmonary effort is normal.  Abdominal:     General: Abdomen is flat.     Palpations: Abdomen is soft. There is no mass.     Tenderness: There is no abdominal tenderness. There is no guarding or rebound.  Skin:    General: Skin is dry.     Coloration: Skin is not pale.  Neurological:     Mental Status: She is alert and oriented to person, place, and time.  Psychiatric:        Behavior: Behavior normal.     BP 94/67    Pulse 76    Ht 5\' 3"  (1.6 m)    Wt 128 lb (58.1 kg)    LMP 03/06/2020    SpO2 100%    BMI 22.67 kg/m  Wt Readings from Last 3 Encounters:  03/20/20 128 lb (58.1 kg)  02/24/20 128 lb (58.1 kg)  08/17/19 126 lb (57.2 kg)    Health Maintenance Due  Topic Date Due   Hepatitis C Screening  Never done    There are no preventive care reminders to display for this patient.   Lab Results  Component Value Date   TSH 1.65 02/24/2020   Lab Results  Component Value Date   WBC 6.1 12/02/2017   HGB 13.1 12/02/2017   HCT 38.8 12/02/2017    MCV 90.7 12/02/2017   PLT 233 12/02/2017   Lab Results  Component Value Date  NA 138 02/24/2020   K 4.2 02/24/2020   CO2 29 02/24/2020   GLUCOSE 79 02/24/2020   BUN 15 02/24/2020   CREATININE 0.81 02/24/2020   BILITOT 0.6 02/24/2020   ALKPHOS 55 11/25/2016   AST 21 02/24/2020   ALT 14 02/24/2020   PROT 6.7 02/24/2020   ALBUMIN 4.3 11/25/2016   CALCIUM 9.1 02/24/2020   Lab Results  Component Value Date   CHOL 150 11/28/2017   Lab Results  Component Value Date   HDL 54 11/28/2017   Lab Results  Component Value Date   LDLCALC 86 11/28/2017   Lab Results  Component Value Date   TRIG 52 11/28/2017   Lab Results  Component Value Date   CHOLHDL 2.8 11/25/2016   No results found for: HGBA1C     Assessment & Plan:   Problem List Items Addressed This Visit    None    Visit Diagnoses    Acute vaginitis    -  Primary   Relevant Orders   WET PREP FOR TRICH, YEAST, CLUE   POCT URINALYSIS DIP (CLINITEK) (Completed)   Hearing loss due to cerumen impaction, left       Microscopic hematuria       Relevant Orders   Urinalysis, microscopic only   Urine Culture      Acute vaginitis-wet prep performed will call with results once available.  Did go ahead and have her do a urinalysis since she was having a little bit of suprapubic discomfort.  Urinalysis just showed moderate blood will send for microscopic only as well as a urine culture for confirmation.  Call for the results once available.   Indication: Cerumen impaction of the left  ear Medical necessity statement: On physical examination, cerumen impairs clinically significant portions of the external auditory canal, and tympanic membrane. Noted obstructive, copious cerumen that cannot be removed without magnification and instrumentations.  Consent: Discussed benefits and risks of procedure and verbal consent obtained Procedure: Patient was prepped for the procedure. Utilized an otoscope to assess and take note of the  ear canal, the tympanic membrane, and the presence, amount, and placement of the cerumen. Gentle water irrigation and soft plastic curette was utilized to remove cerumen.  Post procedure examination: shows cerumen was completely removed. Patient tolerated procedure well. The patient is made aware that they may experience temporary vertigo, temporary hearing loss, and temporary discomfort. If these symptom last for more than 24 hours to call the clinic or proceed to the ED.    No orders of the defined types were placed in this encounter.    [name], MD

## 2020-03-21 ENCOUNTER — Encounter: Payer: Self-pay | Admitting: Family Medicine

## 2020-03-22 LAB — URINALYSIS, MICROSCOPIC ONLY
Bacteria, UA: NONE SEEN /HPF
Hyaline Cast: NONE SEEN /LPF
Squamous Epithelial / HPF: NONE SEEN /HPF (ref <=5)
WBC, UA: NONE SEEN /HPF (ref 0–5)

## 2020-03-22 LAB — URINE CULTURE
MICRO NUMBER:: 10561521
SPECIMEN QUALITY:: ADEQUATE

## 2020-03-22 MED ORDER — NITROFURANTOIN MONOHYD MACRO 100 MG PO CAPS
100.0000 mg | ORAL_CAPSULE | Freq: Two times a day (BID) | ORAL | 0 refills | Status: DC
Start: 2020-03-22 — End: 2020-04-07

## 2020-03-22 NOTE — Addendum Note (Signed)
Addended by: [name] D on: 03/22/2020 09:39 PM   Modules accepted: Orders

## 2020-03-23 ENCOUNTER — Encounter: Payer: Self-pay | Admitting: Family Medicine

## 2020-04-06 ENCOUNTER — Encounter: Payer: Self-pay | Admitting: Family Medicine

## 2020-04-07 ENCOUNTER — Ambulatory Visit (INDEPENDENT_AMBULATORY_CARE_PROVIDER_SITE_OTHER): Payer: BC Managed Care – PPO | Admitting: Physician Assistant

## 2020-04-07 ENCOUNTER — Encounter: Payer: Self-pay | Admitting: Physician Assistant

## 2020-04-07 VITALS — BP 112/62 | HR 76 | Ht 63.0 in | Wt 124.0 lb

## 2020-04-07 DIAGNOSIS — N760 Acute vaginitis: Secondary | ICD-10-CM

## 2020-04-07 LAB — POCT URINALYSIS DIP (CLINITEK)
Bilirubin, UA: NEGATIVE
Glucose, UA: NEGATIVE mg/dL
Ketones, POC UA: NEGATIVE mg/dL
Leukocytes, UA: NEGATIVE
Nitrite, UA: NEGATIVE
POC PROTEIN,UA: NEGATIVE
Spec Grav, UA: 1.01 (ref 1.010–1.025)
Urobilinogen, UA: 0.2 E.U./dL
pH, UA: 7 (ref 5.0–8.0)

## 2020-04-07 MED ORDER — FLUCONAZOLE 150 MG PO TABS
150.0000 mg | ORAL_TABLET | Freq: Once | ORAL | 0 refills | Status: AC
Start: 2020-04-07 — End: 2020-04-07

## 2020-04-07 NOTE — Progress Notes (Signed)
Subjective:    Patient ID: Joann Wilson, female    DOB: 08/20/1971, 49 y.o.   MRN: [id number]  HPI  Patient is a 49 year old female who presents to the clinic to follow-up after acute vaginitis earlier this month on 03/20/2020.  She was seen by her PCP Dr. Charise Wilson.  Her wet prep was normal.  She did turn out to have a urinary tract infection with enteric coccus.  She was treated with Macrobid.  All of her previous symptoms have seemed to clear.  She now is left with some vaginal discharge and itching.  Does not feel like previous abdominal pain.  She does not have a history of urinary tract infections.  She does remember getting a yeast infection after antibiotic in the past.  She is also just finished her menstrual cycle.  She denies any fever, chills, nausea, flank pain.  .. Active Ambulatory Problems    Diagnosis Date Noted  . Hypothyroidism 03/31/2009  . History of anorexia nervosa 03/31/2009  . Migraine with aura 03/31/2009  . ALLERGIC RHINITIS 03/31/2009  . BACK PAIN, LUMBAR, CHRONIC 03/31/2009  . FIBROCYSTIC BREAST DISEASE, HX OF 03/31/2009  . GAD (generalized anxiety disorder) 02/28/2014  . Skin lump of arm 02/15/2015  . Vitamin D deficiency 04/09/2016  . Low iron 04/09/2016  . Hormone replacement therapy (HRT) 08/18/2019   Resolved Ambulatory Problems    Diagnosis Date Noted  . No Resolved Ambulatory Problems   Past Medical History:  Diagnosis Date  . Fibrocystic breast   . History of anxiety       Review of Systems See hPI.     Objective:   Physical Exam Vitals reviewed.  Constitutional:      Appearance: Normal appearance.  HENT:     Head: Normocephalic.  Cardiovascular:     Rate and Rhythm: Normal rate and regular rhythm.     Pulses: Normal pulses.  Pulmonary:     Effort: Pulmonary effort is normal.     Breath sounds: Normal breath sounds.  Abdominal:     General: Bowel sounds are normal.     Palpations: Abdomen is soft.     Tenderness: There is no  abdominal tenderness. There is no right CVA tenderness or left CVA tenderness.  Neurological:     General: No focal deficit present.     Mental Status: She is alert and oriented to person, place, and time.  Psychiatric:        Mood and Affect: Mood normal.           Assessment & Plan:  Marland KitchenMarland KitchenRissie was seen today for vaginal itching.  Diagnoses and all orders for this visit:  Acute vaginitis -     POCT URINALYSIS DIP (CLINITEK) -     WET PREP FOR [place], YEAST, CLUE  .Marland Kitchen Results for orders placed or performed in visit on 04/07/20  POCT URINALYSIS DIP (CLINITEK)  Result Value Ref Range   Color, UA yellow yellow   Clarity, UA clear clear   Glucose, UA negative negative mg/dL   Bilirubin, UA negative negative   Ketones, POC UA negative negative mg/dL   Spec Grav, UA 1.010 1.010 - 1.025   Blood, UA moderate (A) negative   pH, UA 7.0 5.0 - 8.0   POC PROTEIN,UA negative negative, trace   Urobilinogen, UA 0.2 0.2 or 1.0 E.U./dL   Nitrite, UA Negative Negative   Leukocytes, UA Negative Negative    Check UA for UTI clearance.  Pt just finished menstrual cycle  likely blood is from that.  Will culture.  Check Wet prep for BV vs yeast. More likely yeast due to recent abx use. Sent diflucan to get relief for weekend.  Will call pt with results. Follow up as needed.

## 2020-04-07 NOTE — Patient Instructions (Signed)
Vaginitis Vaginitis is a condition in which the vaginal tissue swells and becomes red (inflamed). This condition is most often caused by a change in the normal balance of bacteria and yeast that live in the vagina. This change causes an overgrowth of certain bacteria or yeast, which causes the inflammation. There are different types of vaginitis, but the most common types are:  Bacterial vaginosis.  Yeast infection (candidiasis).  Trichomoniasis vaginitis. This is a sexually transmitted disease (STD).  Viral vaginitis.  Atrophic vaginitis.  Allergic vaginitis. What are the causes? The cause of this condition depends on the type of vaginitis. It can be caused by:  Bacteria (bacterial vaginosis).  Yeast, which is a fungus (yeast infection).  A parasite (trichomoniasis vaginitis).  A virus (viral vaginitis).  Low hormone levels (atrophic vaginitis). Low hormone levels can occur during pregnancy, breastfeeding, or after menopause.  Irritants, such as bubble baths, scented tampons, and feminine sprays (allergic vaginitis). Other factors can change the normal balance of the yeast and bacteria that live in the vagina. These include:  Antibiotic medicines.  Poor hygiene.  Diaphragms, vaginal sponges, spermicides, birth control pills, and intrauterine devices (IUD).  Sex.  Infection.  Uncontrolled diabetes.  A weakened defense (immune) system. What increases the risk? This condition is more likely to develop in women who:  Smoke.  Use vaginal douches, scented tampons, or scented sanitary pads.  Wear tight-fitting pants.  Wear thong underwear.  Use oral birth control pills or an IUD.  Have sex without a condom.  Have multiple sex partners.  Have an STD.  Frequently use the spermicide nonoxynol-9.  Eat lots of foods high in sugar.  Have uncontrolled diabetes.  Have low estrogen levels.  Have a weakened immune system from an immune disorder or medical  treatment.  Are pregnant or breastfeeding. What are the signs or symptoms? Symptoms vary depending on the cause of the vaginitis. Common symptoms include:  Abnormal vaginal discharge. ? The discharge is white, gray, or yellow with bacterial vaginosis. ? The discharge is thick, white, and cheesy with a yeast infection. ? The discharge is frothy and yellow or greenish with trichomoniasis.  A bad vaginal smell. The smell is fishy with bacterial vaginosis.  Vaginal itching, pain, or swelling.  Sex that is painful.  Pain or burning when urinating. Sometimes there are no symptoms. How is this diagnosed? This condition is diagnosed based on your symptoms and medical history. A physical exam, including a pelvic exam, will also be done. You may also have other tests, including:  Tests to determine the pH level (acidity or alkalinity) of your vagina.  A whiff test, to assess the odor that results when a sample of your vaginal discharge is mixed with a potassium hydroxide solution.  Tests of vaginal fluid. A sample will be examined under a microscope. How is this treated? Treatment varies depending on the type of vaginitis you have. Your treatment may include:  Antibiotic creams or pills to treat bacterial vaginosis and trichomoniasis.  Antifungal medicines, such as vaginal creams or suppositories, to treat a yeast infection.  Medicine to ease discomfort if you have viral vaginitis. Your sexual partner should also be treated.  Estrogen delivered in a cream, pill, suppository, or vaginal ring to treat atrophic vaginitis. If vaginal dryness occurs, lubricants and moisturizing creams may help. You may need to avoid scented soaps, sprays, or douches.  Stopping use of a product that is causing allergic vaginitis. Then using a vaginal cream to treat the symptoms. Follow   these instructions at home: Lifestyle  Keep your genital area clean and dry. Avoid soap, and only rinse the area with  water.  Do not douche or use tampons until your health care provider says it is okay to do so. Use sanitary pads, if needed.  Do not have sex until your health care provider approves. When you can return to sex, practice safe sex and use condoms.  Wipe from front to back. This avoids the spread of bacteria from the rectum to the vagina. General instructions  Take over-the-counter and prescription medicines only as told by your health care provider.  If you were prescribed an antibiotic medicine, take or use it as told by your health care provider. Do not stop taking or using the antibiotic even if you start to feel better.  Keep all follow-up visits as told by your health care provider. This is important. How is this prevented?  Use mild, non-scented products. Do not use things that can irritate the vagina, such as fabric softeners. Avoid the following products if they are scented: ? Feminine sprays. ? Detergents. ? Tampons. ? Feminine hygiene products. ? Soaps or bubble baths.  Let air reach your genital area. ? Wear cotton underwear to reduce moisture buildup. ? Avoid wearing underwear while you sleep. ? Avoid wearing tight pants and underwear or nylons without a cotton panel. ? Avoid wearing thong underwear.  Take off any wet clothing, such as bathing suits, as soon as possible.  Practice safe sex and use condoms. Contact a health care provider if:  You have abdominal pain.  You have a fever.  You have symptoms that last for more than 2-3 days. Get help right away if:  You have a fever and your symptoms suddenly get worse. Summary  Vaginitis is a condition in which the vaginal tissue becomes inflamed.This condition is most often caused by a change in the normal balance of bacteria and yeast that live in the vagina.  Treatment varies depending on the type of vaginitis you have.  Do not douche, use tampons , or have sex until your health care provider approves. When  you can return to sex, practice safe sex and use condoms. This information is not intended to replace advice given to you by your health care provider. Make sure you discuss any questions you have with your health care provider. Document Revised: 09/12/2017 Document Reviewed: 11/05/2016 Elsevier Patient Education  2020 [employer] Inc[employer]  

## 2020-04-07 NOTE — Progress Notes (Signed)
I ordered culture for you but needs to be packaged.

## 2020-04-08 ENCOUNTER — Other Ambulatory Visit: Payer: Self-pay | Admitting: Medical-Surgical

## 2020-04-08 DIAGNOSIS — N760 Acute vaginitis: Secondary | ICD-10-CM

## 2020-04-08 LAB — URINE CULTURE
MICRO NUMBER:: 10635614
Result:: NO GROWTH
SPECIMEN QUALITY:: ADEQUATE

## 2020-04-08 LAB — WET PREP FOR TRICH, YEAST, CLUE
MICRO NUMBER:: 10635257
Specimen Quality: ADEQUATE

## 2020-04-08 MED ORDER — CLOTRIMAZOLE-BETAMETHASONE 1-0.05 % EX CREA: 1.0000 "application " | TOPICAL_CREAM | Freq: Two times a day (BID) | CUTANEOUS | 0 refills | Status: DC

## 2020-04-08 NOTE — Progress Notes (Signed)
Called patient with negative results of wet prep for trich, BV, and yeast. She continues to have vaginal itching and notes there is irritation around her anus. She endorses that her stools are often loose due to the magnesium she takes at night and wonders if the irritation is related to wiping. She did take one of the two doses of Diflucan yesterday. Discussed results. Patient asking if there is any cream or ointment that may help with her symptoms. As her vaginal itching and irritation is external, may benefit from a trial of Lotrisone cream twice daily. Patient agreeable to try this, sent med to the pharmacy on file. Advised to stop use of Lotrisone if she notices a worsening of her symptoms. Still pending urine culture. Advised patient that we will call her with those results are available.

## 2020-04-10 NOTE — Progress Notes (Signed)
Enna,   No bacteria in urine culture. No trich, yeast, clue cells seen.

## 2020-05-08 ENCOUNTER — Other Ambulatory Visit: Payer: Self-pay | Admitting: Family Medicine

## 2020-05-08 DIAGNOSIS — Z1231 Encounter for screening mammogram for malignant neoplasm of breast: Secondary | ICD-10-CM

## 2020-05-12 DIAGNOSIS — Z86010 Personal history of colonic polyps: Secondary | ICD-10-CM | POA: Insufficient documentation

## 2020-05-12 LAB — HM COLONOSCOPY

## 2020-05-24 ENCOUNTER — Encounter: Payer: Self-pay | Admitting: Family Medicine

## 2020-05-24 LAB — NICOTINE METABOLITE, SERUM: Nicotine Metabolite Screen: NEGATIVE

## 2020-06-20 ENCOUNTER — Encounter: Payer: Self-pay | Admitting: Family Medicine

## 2020-08-09 ENCOUNTER — Other Ambulatory Visit: Payer: Self-pay

## 2020-08-09 ENCOUNTER — Ambulatory Visit (INDEPENDENT_AMBULATORY_CARE_PROVIDER_SITE_OTHER): Payer: BC Managed Care – PPO

## 2020-08-09 DIAGNOSIS — Z1231 Encounter for screening mammogram for malignant neoplasm of breast: Secondary | ICD-10-CM | POA: Diagnosis not present

## 2020-08-10 ENCOUNTER — Other Ambulatory Visit: Payer: Self-pay | Admitting: Family Medicine

## 2020-08-10 DIAGNOSIS — R928 Other abnormal and inconclusive findings on diagnostic imaging of breast: Secondary | ICD-10-CM

## 2020-08-23 ENCOUNTER — Other Ambulatory Visit: Payer: Self-pay

## 2020-08-23 ENCOUNTER — Ambulatory Visit
Admission: RE | Admit: 2020-08-23 | Discharge: 2020-08-23 | Disposition: A | Discharge status: Home / Self Care | Payer: BC Managed Care – PPO | Source: Ambulatory Visit | Attending: Family Medicine | Admitting: Family Medicine

## 2020-08-23 DIAGNOSIS — R928 Other abnormal and inconclusive findings on diagnostic imaging of breast: Secondary | ICD-10-CM

## 2020-08-24 ENCOUNTER — Other Ambulatory Visit: Payer: Self-pay | Admitting: Family Medicine

## 2020-08-24 ENCOUNTER — Encounter: Payer: Self-pay | Admitting: Family Medicine

## 2020-08-24 ENCOUNTER — Ambulatory Visit (INDEPENDENT_AMBULATORY_CARE_PROVIDER_SITE_OTHER): Payer: BC Managed Care – PPO | Admitting: Family Medicine

## 2020-08-24 VITALS — BP 104/60 | HR 75 | Ht 64.0 in | Wt 122.0 lb

## 2020-08-24 DIAGNOSIS — J301 Allergic rhinitis due to pollen: Secondary | ICD-10-CM | POA: Diagnosis not present

## 2020-08-24 DIAGNOSIS — Z79890 Hormone replacement therapy (postmenopausal): Secondary | ICD-10-CM

## 2020-08-24 DIAGNOSIS — G43109 Migraine with aura, not intractable, without status migrainosus: Secondary | ICD-10-CM

## 2020-08-24 DIAGNOSIS — R0981 Nasal congestion: Secondary | ICD-10-CM

## 2020-08-24 DIAGNOSIS — E038 Other specified hypothyroidism: Secondary | ICD-10-CM

## 2020-08-24 MED ORDER — FLUTICASONE PROPIONATE 50 MCG/ACT NA SUSP: NASAL | 11 refills | Status: DC

## 2020-08-24 MED ORDER — RIZATRIPTAN BENZOATE 10 MG PO TBDP: 10.0000 mg | ORAL_TABLET | ORAL | 99 refills | Status: DC | PRN

## 2020-08-24 NOTE — Progress Notes (Signed)
Established Patient Office Visit  Subjective:  Patient ID: Joann Wilson, female    DOB: 1971/07/18  Age: 49 y.o. MRN: [id number]  CC:  Chief Complaint  Patient presents with  . Hypothyroidism    HPI Braylei Totino presents for 6 mo f/u  Hypothyroidism - Taking medication regularly in the AM away from food and vitamins, etc. No recent change to skin, hair, or energy levels.  F/U HRT -she feels like she is actually doing fairly well on her hormones she did well let me know that she has had a shift in her menstrual cycle normally she has been going maybe every 24 to 27 days and on her last.  She actually went 39 days but she says that both of her daughters have started menstruating and feels like in the past sometimes her period will start to align with the people around her and wonders if that may be part of what is happening.  But she otherwise actually feels like she is doing well this past year has been particularly stressful with Covid and feels like she is getting to the other side of things.  Recently had a bit of a scare with getting a call back on her mammogram she wonders if there is anything that she could do differently or be able to order a diagnostic each year instead of just the screening.  Everything checked out normal she does have more dense breast tissue.  And they recommended a screening next year she says she may schedule it at the breast center instead of downstairs.  Rhinitis-she would like a refill on her fluticasone.  She does sometimes get some pressure in her ear she says she definitely uses the Flonase in the spring and the fall but sometimes will even use it in between depending on her allergies.  Past Medical History:  Diagnosis Date  . Fibrocystic breast   . History of anxiety     Past Surgical History:  Procedure Laterality Date  . NO PAST SURGERIES      Family History  Problem Relation Age of Onset  . Heart attack Maternal Grandmother 55  . Heart attack  Father 32  . Hyperlipidemia Father   . Hypertension Father   . Diabetes Father   . Hyperlipidemia Mother   . Hypertension Mother   . Diabetes Mother   . Migraines Mother     Social History   Socioeconomic History  . Marital status: Married    Spouse name: Onalee Hua  . Number of children: 2  . Years of education: college  . Highest education level: Not on file  Occupational History  . Occupation: Homemaker  Tobacco Use  . Smoking status: Never Smoker  . Smokeless tobacco: Never Used  Substance and Sexual Activity  . Alcohol use: Yes    Comment: rarely  . Drug use: No  . Sexual activity: Yes    Partners: Male    Birth control/protection: Condom  Other Topics Concern  . Not on file  Social History Narrative   Does cardio workout 30-45 minutes 3 days per week. No caffeine intake.   Social Determinants of Health   Financial Resource Strain:   . Difficulty of Paying Living Expenses: Not on file  Food Insecurity:   . Worried About Programme researcher, broadcasting/film/video in the Last Year: Not on file  . Ran Out of Food in the Last Year: Not on file  Transportation Needs:   . Lack of Transportation (Medical): Not on file  .  Lack of Transportation (Non-Medical): Not on file  Physical Activity:   . Days of Exercise per Week: Not on file  . Minutes of Exercise per Session: Not on file  Stress:   . Feeling of Stress : Not on file  Social Connections:   . Frequency of Communication with Friends and Family: Not on file  . Frequency of Social Gatherings with Friends and Family: Not on file  . Attends Religious Services: Not on file  . Active Member of Clubs or Organizations: Not on file  . Attends BankerClub or Organization Meetings: Not on file  . Marital Status: Not on file  Intimate Partner Violence:   . Fear of Current or Ex-Partner: Not on file  . Emotionally Abused: Not on file  . Physically Abused: Not on file  . Sexually Abused: Not on file    Outpatient Medications Prior to Visit   Medication Sig Dispense Refill  . AMBULATORY NON FORMULARY MEDICATION Medication Name: testosterone 1mg /0.895ml.  Apply 0.385ml once daily 30 mL 6  . AMBULATORY NON FORMULARY MEDICATION Medication Name: progesterone 50 mg per 0.5 mL cream. Apply 0.25 mL at bedtime. 30 mL 6  . AMBULATORY NON FORMULARY MEDICATION Medication Name: progesterone 10% (100 mg/mL). Apply 0.25 mL (1 click) daily at bedtime.  Fax: [phone] 24 mL 6  . AMBULATORY NON FORMULARY MEDICATION Medication Name: Testosterone 2 mg/mL (0.2%) Cream. Apply 0.5 mL (2 clicks) ONCE daily as directed 45 mL 5  . cetirizine (ZYRTEC) 10 MG tablet Take 10 mg by mouth as needed for allergies.    . cholecalciferol (VITAMIN D) 1000 units tablet Take 1,000 Units by mouth daily.    . clotrimazole-betamethasone (LOTRISONE) cream Apply 1 application topically 2 (two) times daily. 45 g 0  . IRON PO Take by mouth daily.    . Magnesium 250 MG TABS Take 1 tablet by mouth at bedtime.    . metroNIDAZOLE (METROGEL) 1 % gel Apply 1 application topically daily.    . Multiple Vitamins-Minerals (MULTIVITAMIN WITH MINERALS) tablet Take 1 tablet by mouth daily.    . SOOLANTRA 1 % CREA APPLY THIN LAYER TO AFFECTED AREA ONCE DAILY AS NEEDED    . AMBULATORY NON FORMULARY MEDICATION Medication Name: Helio care    . fluticasone (FLONASE) 50 MCG/ACT nasal spray SHAKE AND INSTILL 2 SPRAYS IN EACH NOSTRIL EVERY DAY 16 g 4  . rizatriptan (MAXALT-MLT) 10 MG disintegrating tablet Take 1 tablet (10 mg total) by mouth as needed for migraine. May repeat in 2 hours if needed 10 tablet PRN  . SYNTHROID 125 MCG tablet TAKE 1 TABLET BY MOUTH DAILY BEFORE BREAKFAST 90 tablet 2   No facility-administered medications prior to visit.    Allergies  Allergen Reactions  . Citalopram Other (See Comments)    Felt numb all over   . Fluoxetine Other (See Comments)    Insomnia  . Zoloft [Sertraline Hcl] Other (See Comments)    Felt numb all over    ROS Review of Systems     Objective:    Physical Exam Constitutional:      Appearance: She is well-developed.  HENT:     Head: Normocephalic and atraumatic.  Cardiovascular:     Rate and Rhythm: Normal rate and regular rhythm.     Heart sounds: Normal heart sounds.  Pulmonary:     Effort: Pulmonary effort is normal.     Breath sounds: Normal breath sounds.  Skin:    General: Skin is warm and dry.  Neurological:  Mental Status: She is alert and oriented to person, place, and time.  Psychiatric:        Behavior: Behavior normal.     BP 104/60   Pulse 75   Ht 5\' 4"  (1.626 m)   Wt 122 lb (55.3 kg)   SpO2 100%   BMI 20.94 kg/m  Wt Readings from Last 3 Encounters:  08/24/20 122 lb (55.3 kg)  02/16/20 126 lb (57.2 kg)  04/07/20 124 lb (56.2 kg)     Health Maintenance Due  Topic Date Due  . Hepatitis C Screening  Never done    There are no preventive care reminders to display for this patient.  Lab Results  Component Value Date   TSH 1.65 02/24/2020   Lab Results  Component Value Date   WBC 6.1 12/02/2017   HGB 13.1 12/02/2017   HCT 38.8 12/02/2017   MCV 90.7 12/02/2017   PLT 233 12/02/2017   Lab Results  Component Value Date   NA 138 02/24/2020   K 4.2 02/24/2020   CO2 29 02/24/2020   GLUCOSE 79 02/24/2020   BUN 15 02/24/2020   CREATININE 0.81 02/24/2020   BILITOT 0.6 02/24/2020   ALKPHOS 55 11/25/2016   AST 21 02/24/2020   ALT 14 02/24/2020   PROT 6.7 02/24/2020   ALBUMIN 4.3 11/25/2016   CALCIUM 9.1 02/24/2020   Lab Results  Component Value Date   CHOL 168 02/16/2020   Lab Results  Component Value Date   HDL 50 02/16/2020   Lab Results  Component Value Date   LDLCALC 109 02/16/2020   Lab Results  Component Value Date   TRIG 45 02/16/2020   Lab Results  Component Value Date   CHOLHDL 2.8 11/25/2016   No results found for: HGBA1C    Assessment & Plan:   Problem List Items Addressed This Visit      Cardiovascular and Mediastinum   Migraine with  aura    We will send refill for Maxalt to be placed on file.      Relevant Medications   rizatriptan (MAXALT-MLT) 10 MG disintegrating tablet     Respiratory   Allergic rhinitis    Refill fluticasone.        Endocrine   Hypothyroidism - Primary    He is actually doing really well so were not going to check her TSH at this time we will just repeat in May which will be 1 year if at any point she feels like something is changing then we can certainly recheck it.        Other   Hormone replacement therapy (HRT)    Pretty stable on current regimen we will continue to monitor menstrual cycle.       Other Visit Diagnoses    Nasal congestion       Relevant Medications   fluticasone (FLONASE) 50 MCG/ACT nasal spray      Meds ordered this encounter  Medications  . fluticasone (FLONASE) 50 MCG/ACT nasal spray    Sig: SHAKE AND INSTILL 2 SPRAYS IN EACH NOSTRIL EVERY DAY    Dispense:  16 g    Refill:  11  . rizatriptan (MAXALT-MLT) 10 MG disintegrating tablet    Sig: Take 1 tablet (10 mg total) by mouth as needed for migraine. May repeat in 2 hours if needed    Dispense:  10 tablet    Refill:  PRN    Please place on file.  Patient will call when needed.  Follow-up: Return in about 6 months (around 02/21/2021) for Wellness Exam.    [name], MD

## 2020-08-24 NOTE — Assessment & Plan Note (Signed)
Pretty stable on current regimen we will continue to monitor menstrual cycle.

## 2020-08-24 NOTE — Assessment & Plan Note (Signed)
We will send refill for Maxalt to be placed on file.

## 2020-08-24 NOTE — Assessment & Plan Note (Signed)
Refill fluticasone

## 2020-08-24 NOTE — Assessment & Plan Note (Signed)
He is actually doing really well so were not going to check her TSH at this time we will just repeat in May which will be 1 year if at any point she feels like something is changing then we can certainly recheck it.

## 2020-08-28 ENCOUNTER — Other Ambulatory Visit: Payer: BC Managed Care – PPO

## 2020-10-20 ENCOUNTER — Other Ambulatory Visit: Payer: Self-pay | Admitting: Family Medicine

## 2020-10-20 ENCOUNTER — Encounter: Payer: Self-pay | Admitting: Family Medicine

## 2020-10-20 MED ORDER — AMBULATORY NON FORMULARY MEDICATION: 5 refills | Status: DC

## 2020-10-20 NOTE — Progress Notes (Signed)
Printed rx for testosterone.  Pt will have to pick up.

## 2020-10-24 ENCOUNTER — Other Ambulatory Visit: Payer: Self-pay | Admitting: *Deleted

## 2021-02-20 ENCOUNTER — Other Ambulatory Visit: Payer: Self-pay | Admitting: Family Medicine

## 2021-02-21 ENCOUNTER — Encounter: Payer: BC Managed Care – PPO | Admitting: Family Medicine

## 2021-02-22 ENCOUNTER — Other Ambulatory Visit: Payer: Self-pay

## 2021-02-22 ENCOUNTER — Encounter: Payer: Self-pay | Admitting: Family Medicine

## 2021-02-22 ENCOUNTER — Ambulatory Visit (INDEPENDENT_AMBULATORY_CARE_PROVIDER_SITE_OTHER): Payer: BC Managed Care – PPO | Admitting: Family Medicine

## 2021-02-22 VITALS — BP 113/66 | HR 79 | Ht 64.0 in | Wt 123.0 lb

## 2021-02-22 DIAGNOSIS — Z0000 Encounter for general adult medical examination without abnormal findings: Secondary | ICD-10-CM | POA: Diagnosis not present

## 2021-02-22 DIAGNOSIS — E038 Other specified hypothyroidism: Secondary | ICD-10-CM

## 2021-02-22 DIAGNOSIS — Z79890 Hormone replacement therapy (postmenopausal): Secondary | ICD-10-CM | POA: Diagnosis not present

## 2021-02-22 DIAGNOSIS — Z1211 Encounter for screening for malignant neoplasm of colon: Secondary | ICD-10-CM

## 2021-02-22 NOTE — Patient Instructions (Signed)
Health Maintenance, Female Adopting a healthy lifestyle and getting preventive care are important in promoting health and wellness. Ask your health care provider about:  The right schedule for you to have regular tests and exams.  Things you can do on your own to prevent diseases and keep yourself healthy. What should I know about diet, weight, and exercise? Eat a healthy diet  Eat a diet that includes plenty of vegetables, fruits, low-fat dairy products, and lean protein.  Do not eat a lot of foods that are high in solid fats, added sugars, or sodium.   Maintain a healthy weight Body mass index (BMI) is used to identify weight problems. It estimates body fat based on height and weight. Your health care provider can help determine your BMI and help you achieve or maintain a healthy weight. Get regular exercise Get regular exercise. This is one of the most important things you can do for your health. Most adults should:  Exercise for at least 150 minutes each week. The exercise should increase your heart rate and make you sweat (moderate-intensity exercise).  Do strengthening exercises at least twice a week. This is in addition to the moderate-intensity exercise.  Spend less time sitting. Even light physical activity can be beneficial. Watch cholesterol and blood lipids Have your blood tested for lipids and cholesterol at 51 years of age, then have this test every 5 years. Have your cholesterol levels checked more often if:  Your lipid or cholesterol levels are high.  You are older than 51 years of age.  You are at high risk for heart disease. What should I know about cancer screening? Depending on your health history and family history, you may need to have cancer screening at various ages. This may include screening for:  Breast cancer.  Cervical cancer.  Colorectal cancer.  Skin cancer.  Lung cancer. What should I know about heart disease, diabetes, and high blood  pressure? Blood pressure and heart disease  High blood pressure causes heart disease and increases the risk of stroke. This is more likely to develop in people who have high blood pressure readings, are of African descent, or are overweight.  Have your blood pressure checked: ? Every 3-5 years if you are 18-39 years of age. ? Every year if you are 40 years old or older. Diabetes Have regular diabetes screenings. This checks your fasting blood sugar level. Have the screening done:  Once every three years after age 40 if you are at a normal weight and have a low risk for diabetes.  More often and at a younger age if you are overweight or have a high risk for diabetes. What should I know about preventing infection? Hepatitis B If you have a higher risk for hepatitis B, you should be screened for this virus. Talk with your health care provider to find out if you are at risk for hepatitis B infection. Hepatitis C Testing is recommended for:  Everyone born from 1945 through 1965.  Anyone with known risk factors for hepatitis C. Sexually transmitted infections (STIs)  Get screened for STIs, including gonorrhea and chlamydia, if: ? You are sexually active and are younger than 51 years of age. ? You are older than 51 years of age and your health care provider tells you that you are at risk for this type of infection. ? Your sexual activity has changed since you were last screened, and you are at increased risk for chlamydia or gonorrhea. Ask your health care provider   if you are at risk.  Ask your health care provider about whether you are at high risk for HIV. Your health care provider may recommend a prescription medicine to help prevent HIV infection. If you choose to take medicine to prevent HIV, you should first get tested for HIV. You should then be tested every 3 months for as long as you are taking the medicine. Pregnancy  If you are about to stop having your period (premenopausal) and  you may become pregnant, seek counseling before you get pregnant.  Take 400 to 800 micrograms (mcg) of folic acid every day if you become pregnant.  Ask for birth control (contraception) if you want to prevent pregnancy. Osteoporosis and menopause Osteoporosis is a disease in which the bones lose minerals and strength with aging. This can result in bone fractures. If you are 65 years old or older, or if you are at risk for osteoporosis and fractures, ask your health care provider if you should:  Be screened for bone loss.  Take a calcium or vitamin D supplement to lower your risk of fractures.  Be given hormone replacement therapy (HRT) to treat symptoms of menopause. Follow these instructions at home: Lifestyle  Do not use any products that contain nicotine or tobacco, such as cigarettes, e-cigarettes, and chewing tobacco. If you need help quitting, ask your health care provider.  Do not use street drugs.  Do not share needles.  Ask your health care provider for help if you need support or information about quitting drugs. Alcohol use  Do not drink alcohol if: ? Your health care provider tells you not to drink. ? You are pregnant, may be pregnant, or are planning to become pregnant.  If you drink alcohol: ? Limit how much you use to 0-1 drink a day. ? Limit intake if you are breastfeeding.  Be aware of how much alcohol is in your drink. In the [place] one drink equals one 12 oz bottle of beer (355 mL), one 5 oz glass of wine (148 mL), or one 1 oz glass of hard liquor (44 mL). General instructions  Schedule regular health, dental, and eye exams.  Stay current with your vaccines.  Tell your health care provider if: ? You often feel depressed. ? You have ever been abused or do not feel safe at home. Summary  Adopting a healthy lifestyle and getting preventive care are important in promoting health and wellness.  Follow your health care provider's instructions about healthy  diet, exercising, and getting tested or screened for diseases.  Follow your health care provider's instructions on monitoring your cholesterol and blood pressure. This information is not intended to replace advice given to you by your health care provider. Make sure you discuss any questions you have with your health care provider. Document Revised: 09/23/2018 Document Reviewed: 09/23/2018 Elsevier Patient Education  2021 [employer].  

## 2021-02-22 NOTE — Progress Notes (Signed)
  Subjective:     Joann Wilson is a 50 y.o. female and is here for a comprehensive physical exam. The patient reports no problems.  Social History   Socioeconomic History  . Marital status: Married    Spouse name: Onalee Hua  . Number of children: 2  . Years of education: college  . Highest education level: Not on file  Occupational History  . Occupation: Homemaker  Tobacco Use  . Smoking status: Never Smoker  . Smokeless tobacco: Never Used  Substance and Sexual Activity  . Alcohol use: Yes    Comment: rarely  . Drug use: No  . Sexual activity: Yes    Partners: Male    Birth control/protection: Condom  Other Topics Concern  . Not on file  Social History Narrative   Does cardio workout 30-45 minutes 3 days per week. No caffeine intake.   Social Determinants of Health   Financial Resource Strain: Not on file  Food Insecurity: Not on file  Transportation Needs: Not on file  Physical Activity: Not on file  Stress: Not on file  Social Connections: Not on file  Intimate Partner Violence: Not on file   Health Maintenance  Topic Date Due  . Hepatitis C Screening  Never done  . INFLUENZA VACCINE  05/14/2021  . MAMMOGRAM  08/09/2022  . PAP SMEAR-Modifier  03/16/2024  . COLONOSCOPY (Pts 45-7yrs Insurance coverage will need to be confirmed)  05/13/2027  . TETANUS/TDAP  03/16/2029  . COVID-19 Vaccine  Completed  . HIV Screening  Completed  . HPV VACCINES  Aged Out    The following portions of the patient's history were reviewed and updated as appropriate: allergies, current medications, past family history, past medical history, past social history, past surgical history and problem list.  Review of Systems A comprehensive review of systems was negative.   Objective:    BP 113/66   Pulse 79   Ht 5\' 4"  (1.626 m)   Wt 123 lb (55.8 kg)   SpO2 100%   BMI 21.11 kg/m  General appearance: alert, cooperative and appears stated age Head: Normocephalic, without obvious  abnormality, atraumatic Eyes: conjunctivae/corneas clear. PERRL, EOM's intact. Fundi benign. Ears: normal TM's and external ear canals both ears Nose: Nares normal. Septum midline. Mucosa normal. No drainage or sinus tenderness. Throat: lips, mucosa, and tongue normal; teeth and gums normal Neck: no adenopathy, no carotid bruit, no JVD, supple, symmetrical, trachea midline and thyroid not enlarged, symmetric, no tenderness/mass/nodules Back: symmetric, no curvature. ROM normal. No CVA tenderness. Lungs: clear to auscultation bilaterally Breasts: normal appearance, no masses or tenderness Heart: regular rate and rhythm, S1, S2 normal, no murmur, click, rub or gallop Abdomen: soft, non-tender; bowel sounds normal; no masses,  no organomegaly Extremities: extremities normal, atraumatic, no cyanosis or edema Pulses: 2+ and symmetric Skin: Skin color, texture, turgor normal. No rashes or lesions Lymph nodes: Cervical, supraclavicular, and axillary nodes normal. Neurologic: Alert and oriented X 3, normal strength and tone. Normal symmetric reflexes. Normal coordination and gait    Assessment:    Healthy female exam.     Plan:     See After Visit Summary for Counseling Recommendations   Keep up a regular exercise program and make sure you are eating a healthy diet Try to eat 4 servings of dairy a day, or if you are lactose intolerant take a calcium with vitamin D daily.  Your vaccines are up to date.

## 2021-02-23 LAB — CBC
HCT: 37.1 % (ref 35.0–45.0)
Hemoglobin: 12.3 g/dL (ref 11.7–15.5)
MCH: 30.3 pg (ref 27.0–33.0)
MCHC: 33.2 g/dL (ref 32.0–36.0)
MCV: 91.4 fL (ref 80.0–100.0)
MPV: 10.8 fL (ref 7.5–12.5)
Platelets: 227 10*3/uL (ref 140–400)
RBC: 4.06 10*6/uL (ref 3.80–5.10)
RDW: 12.8 % (ref 11.0–15.0)
WBC: 7.8 10*3/uL (ref 3.8–10.8)

## 2021-02-23 LAB — COMPLETE METABOLIC PANEL WITH GFR
AG Ratio: 1.8 (calc) (ref 1.0–2.5)
ALT: 21 U/L (ref 6–29)
AST: 25 U/L (ref 10–35)
Albumin: 4.4 g/dL (ref 3.6–5.1)
Alkaline phosphatase (APISO): 58 U/L (ref 37–153)
BUN: 16 mg/dL (ref 7–25)
CO2: 28 mmol/L (ref 20–32)
Calcium: 9.2 mg/dL (ref 8.6–10.4)
Chloride: 104 mmol/L (ref 98–110)
Creat: 0.81 mg/dL (ref 0.50–1.05)
GFR, Est African American: 97 mL/min/{1.73_m2} (ref >=60)
GFR, Est Non African American: 84 mL/min/{1.73_m2} (ref >=60)
Globulin: 2.4 g/dL (calc) (ref 1.9–3.7)
Glucose, Bld: 76 mg/dL (ref 65–99)
Potassium: 4.1 mmol/L (ref 3.5–5.3)
Sodium: 137 mmol/L (ref 135–146)
Total Bilirubin: 0.3 mg/dL (ref 0.2–1.2)
Total Protein: 6.8 g/dL (ref 6.1–8.1)

## 2021-02-23 LAB — IRON,TIBC AND FERRITIN PANEL
%SAT: 27 % (calc) (ref 16–45)
Ferritin: 14 ng/mL — ABNORMAL LOW (ref 16–232)
Iron: 94 ug/dL (ref 45–160)
TIBC: 344 mcg/dL (calc) (ref 250–450)

## 2021-02-23 LAB — PROGESTERONE: Progesterone: 0.9 ng/mL

## 2021-02-23 LAB — ESTRADIOL: Estradiol: 71 pg/mL

## 2021-02-23 LAB — LUTEINIZING HORMONE: LH: 29.7 m[IU]/mL

## 2021-02-23 LAB — FOLLICLE STIMULATING HORMONE: FSH: 20.3 m[IU]/mL

## 2021-02-23 LAB — TSH: TSH: 1.37 mIU/L

## 2021-02-27 ENCOUNTER — Telehealth: Payer: Self-pay

## 2021-02-27 ENCOUNTER — Encounter: Payer: Self-pay | Admitting: Family Medicine

## 2021-02-27 DIAGNOSIS — Z8616 Personal history of COVID-19: Secondary | ICD-10-CM

## 2021-02-27 NOTE — Telephone Encounter (Signed)
Joann Wilson called and states she tested positive for Covid. She states she really doesn't feel bad. She reports some congestion. She is taking Flonase and Mucinex for symptom relief.   Refused virtual visit. She will call back if she starts to feel bad.

## 2021-02-27 NOTE — Telephone Encounter (Signed)
Ok, chart updated

## 2021-04-20 ENCOUNTER — Encounter: Payer: Self-pay | Admitting: Family Medicine

## 2021-05-21 ENCOUNTER — Other Ambulatory Visit: Payer: Self-pay

## 2021-05-21 MED ORDER — AMBULATORY NON FORMULARY MEDICATION: 6 refills | Status: DC

## 2021-05-21 NOTE — Telephone Encounter (Signed)
Prescription faxed

## 2021-06-18 ENCOUNTER — Encounter: Payer: Self-pay | Admitting: Family Medicine

## 2021-06-20 ENCOUNTER — Other Ambulatory Visit: Payer: Self-pay | Admitting: Family Medicine

## 2021-06-20 DIAGNOSIS — Z1231 Encounter for screening mammogram for malignant neoplasm of breast: Secondary | ICD-10-CM

## 2021-07-17 ENCOUNTER — Other Ambulatory Visit: Payer: Self-pay | Admitting: *Deleted

## 2021-07-17 ENCOUNTER — Encounter: Payer: Self-pay | Admitting: Family Medicine

## 2021-07-18 MED ORDER — AMBULATORY NON FORMULARY MEDICATION: 5 refills | Status: DC

## 2021-07-30 ENCOUNTER — Other Ambulatory Visit: Payer: Self-pay

## 2021-07-30 ENCOUNTER — Ambulatory Visit (INDEPENDENT_AMBULATORY_CARE_PROVIDER_SITE_OTHER): Payer: BC Managed Care – PPO | Admitting: Family Medicine

## 2021-07-30 ENCOUNTER — Encounter: Payer: Self-pay | Admitting: Family Medicine

## 2021-07-30 VITALS — BP 107/67 | HR 80

## 2021-07-30 DIAGNOSIS — N3000 Acute cystitis without hematuria: Secondary | ICD-10-CM

## 2021-07-30 LAB — POCT URINALYSIS DIP (CLINITEK)
Bilirubin, UA: NEGATIVE
Glucose, UA: NEGATIVE mg/dL
Ketones, POC UA: NEGATIVE mg/dL
Nitrite, UA: NEGATIVE
POC PROTEIN,UA: 30 — AB
Spec Grav, UA: 1.015 (ref 1.010–1.025)
Urobilinogen, UA: 0.2 E.U./dL
pH, UA: 5 (ref 5.0–8.0)

## 2021-07-30 MED ORDER — SULFAMETHOXAZOLE-TRIMETHOPRIM 800-160 MG PO TABS: 1.0000 | ORAL_TABLET | Freq: Two times a day (BID) | ORAL | 0 refills | Status: DC

## 2021-07-30 NOTE — Progress Notes (Signed)
Acute Office Visit  Subjective:    Patient ID: Joann Wilson, female    DOB: May 22, 1971, 50 y.o.   MRN: [id number]  Chief Complaint  Patient presents with   Urinary Tract Infection     HPI Patient is in today for urinary frequency and dysuria that started yesterday.  No risk factors no recent antibiotic use.  She did notice a little pink tinge with wiping she thought initially she was due to start her period but then noticed it mostly just after urination.  Past Medical History:  Diagnosis Date   Fibrocystic breast    History of anxiety     Past Surgical History:  Procedure Laterality Date   NO PAST SURGERIES      Family History  Problem Relation Age of Onset   Heart attack Maternal Grandmother 52   Heart attack Father 70   Hyperlipidemia Father    Hypertension Father    Diabetes Father    Hyperlipidemia Mother    Hypertension Mother    Diabetes Mother    Migraines Mother     Social History   Socioeconomic History   Marital status: Married    Spouse name: Onalee Hua   Number of children: 2   Years of education: college   Highest education level: Not on file  Occupational History   Occupation: Homemaker  Tobacco Use   Smoking status: Never   Smokeless tobacco: Never  Substance and Sexual Activity   Alcohol use: Yes    Comment: rarely   Drug use: No   Sexual activity: Yes    Partners: Male    Birth control/protection: Condom  Other Topics Concern   Not on file  Social History Narrative   Does cardio workout 30-45 minutes 3 days per week. No caffeine intake.   Social Determinants of Health   Financial Resource Strain: Not on file  Food Insecurity: Not on file  Transportation Needs: Not on file  Physical Activity: Not on file  Stress: Not on file  Social Connections: Not on file  Intimate Partner Violence: Not on file    Outpatient Medications Prior to Visit  Medication Sig Dispense Refill   AMBULATORY NON FORMULARY MEDICATION Medication Name:  testosterone 1mg /0.54ml.  Apply 0.84ml once daily 30 mL 6   AMBULATORY NON FORMULARY MEDICATION Medication Name: progesterone 50 mg per 0.5 mL cream. Apply 0.25 mL at bedtime. 30 mL 6   AMBULATORY NON FORMULARY MEDICATION Medication Name: progesterone 10% (100 mg/mL). Apply 0.25 mL (1 click) daily at bedtime.  Fax: [phone] 24 mL 6   AMBULATORY NON FORMULARY MEDICATION Medication Name: Testosterone 2 mg/mL (0.2%) Cream. Apply 0.5 mL (2 clicks) ONCE daily as directed  fax:[phone] 45 mL 5   cetirizine (ZYRTEC) 10 MG tablet Take 10 mg by mouth as needed for allergies.     cholecalciferol (VITAMIN D) 1000 units tablet Take 1,000 Units by mouth daily.     clotrimazole-betamethasone (LOTRISONE) cream Apply 1 application topically 2 (two) times daily. 45 g 0   fluticasone (FLONASE) 50 MCG/ACT nasal spray SHAKE AND INSTILL 2 SPRAYS IN EACH NOSTRIL EVERY DAY 16 g 11   IRON PO Take by mouth daily.     Magnesium 250 MG TABS Take 1 tablet by mouth at bedtime.     metroNIDAZOLE (METROGEL) 1 % gel Apply 1 application topically daily.     Multiple Vitamins-Minerals (MULTIVITAMIN WITH MINERALS) tablet Take 1 tablet by mouth daily.     rizatriptan (MAXALT-MLT) 10 MG disintegrating tablet Take  1 tablet (10 mg total) by mouth as needed for migraine. May repeat in 2 hours if needed 10 tablet PRN   SOOLANTRA 1 % CREA APPLY THIN LAYER TO AFFECTED AREA ONCE DAILY AS NEEDED     SYNTHROID 125 MCG tablet TAKE 1 TABLET BY MOUTH DAILY BEFORE BREAKFAST 90 tablet 1   No facility-administered medications prior to visit.    Allergies  Allergen Reactions   Citalopram Other (See Comments)    Felt numb all over    Fluoxetine Other (See Comments)    Insomnia   Zoloft [Sertraline Hcl] Other (See Comments)    Felt numb all over    Review of Systems     Objective:    Physical Exam Vitals reviewed.  Constitutional:      Appearance: She is well-developed.  HENT:     Head: Normocephalic and atraumatic.   Eyes:     Conjunctiva/sclera: Conjunctivae normal.  Cardiovascular:     Rate and Rhythm: Normal rate.  Pulmonary:     Effort: Pulmonary effort is normal.  Skin:    General: Skin is dry.     Coloration: Skin is not pale.  Neurological:     Mental Status: She is alert and oriented to person, place, and time.  Psychiatric:        Behavior: Behavior normal.    BP 107/67   Pulse 80   SpO2 99%  Wt Readings from Last 3 Encounters:  02/22/21 123 lb (55.8 kg)  08/24/20 122 lb (55.3 kg)  02/16/20 126 lb (57.2 kg)    Health Maintenance Due  Topic Date Due   Hepatitis C Screening  Never done   COVID-19 Vaccine (4 - Booster for Pfizer series) 01/01/2021   INFLUENZA VACCINE  05/14/2021    There are no preventive care reminders to display for this patient.   Lab Results  Component Value Date   TSH 1.37 02/22/2021   Lab Results  Component Value Date   WBC 7.8 02/22/2021   HGB 12.3 02/22/2021   HCT 37.1 02/22/2021   MCV 91.4 02/22/2021   PLT 227 02/22/2021   Lab Results  Component Value Date   NA 137 02/22/2021   K 4.1 02/22/2021   CO2 28 02/22/2021   GLUCOSE 76 02/22/2021   BUN 16 02/22/2021   CREATININE 0.81 02/22/2021   BILITOT 0.3 02/22/2021   ALKPHOS 55 11/25/2016   AST 25 02/22/2021   ALT 21 02/22/2021   PROT 6.8 02/22/2021   ALBUMIN 4.3 11/25/2016   CALCIUM 9.2 02/22/2021   Lab Results  Component Value Date   CHOL 168 02/16/2020   Lab Results  Component Value Date   HDL 50 02/16/2020   Lab Results  Component Value Date   LDLCALC 109 02/16/2020   Lab Results  Component Value Date   TRIG 45 02/16/2020   Lab Results  Component Value Date   CHOLHDL 2.8 11/25/2016   No results found for: HGBA1C     Assessment & Plan:   Problem List Items Addressed This Visit   None Visit Diagnoses     Acute cystitis without hematuria    -  Primary   Relevant Orders   POCT URINALYSIS DIP (CLINITEK) (Completed)      Acute cystitis-urinalysis consistent  with urinary symptoms.  We will go and treat with Bactrim.  Call if not improving or if symptoms not resolving.  Make sure to drink plenty of water.  Cut back on soda.  Meds ordered this encounter  Medications   sulfamethoxazole-trimethoprim (BACTRIM DS) 800-160 MG tablet    Sig: Take 1 tablet by mouth 2 (two) times daily.    Dispense:  6 tablet    Refill:  0      [name], MD

## 2021-08-10 ENCOUNTER — Ambulatory Visit
Admission: RE | Admit: 2021-08-10 | Discharge: 2021-08-10 | Disposition: A | Discharge status: Home / Self Care | Payer: BC Managed Care – PPO | Source: Ambulatory Visit | Attending: Family Medicine | Admitting: Family Medicine

## 2021-08-10 ENCOUNTER — Other Ambulatory Visit: Payer: Self-pay

## 2021-08-10 DIAGNOSIS — Z1231 Encounter for screening mammogram for malignant neoplasm of breast: Secondary | ICD-10-CM

## 2021-08-14 NOTE — Progress Notes (Signed)
Please call patient. Normal mammogram.  Repeat in 1 year.  

## 2021-08-19 ENCOUNTER — Other Ambulatory Visit: Payer: Self-pay | Admitting: Family Medicine

## 2021-08-27 ENCOUNTER — Other Ambulatory Visit: Payer: Self-pay

## 2021-08-27 ENCOUNTER — Ambulatory Visit (INDEPENDENT_AMBULATORY_CARE_PROVIDER_SITE_OTHER): Payer: BC Managed Care – PPO | Admitting: Family Medicine

## 2021-08-27 ENCOUNTER — Encounter: Payer: Self-pay | Admitting: Family Medicine

## 2021-08-27 VITALS — BP 105/64 | HR 74 | Ht 64.0 in | Wt 123.0 lb

## 2021-08-27 DIAGNOSIS — Z79890 Hormone replacement therapy (postmenopausal): Secondary | ICD-10-CM

## 2021-08-27 DIAGNOSIS — M238X2 Other internal derangements of left knee: Secondary | ICD-10-CM

## 2021-08-27 DIAGNOSIS — G43109 Migraine with aura, not intractable, without status migrainosus: Secondary | ICD-10-CM | POA: Diagnosis not present

## 2021-08-27 DIAGNOSIS — E038 Other specified hypothyroidism: Secondary | ICD-10-CM | POA: Diagnosis not present

## 2021-08-27 DIAGNOSIS — R790 Abnormal level of blood mineral: Secondary | ICD-10-CM

## 2021-08-27 DIAGNOSIS — E611 Iron deficiency: Secondary | ICD-10-CM

## 2021-08-27 NOTE — Assessment & Plan Note (Signed)
Still uses ibuprofen for relief.  Okay to refill Maxalt if needed.

## 2021-08-27 NOTE — Progress Notes (Signed)
Established Patient Office Visit  Subjective:  Patient ID: Joann Wilson, female    DOB: 05-01-1971  Age: 50 y.o. MRN: [id number]  CC:  Chief Complaint  Patient presents with   Hypothyroidism    HPI RODERICK SWEEZY presents for   F/U Migraines - she is doing well overall.  She says she rarely uses her maxalt. Relies mostly on IBU.    Hypothyroidism - Taking medication regularly in the AM away from food and vitamins, etc. No recent change to skin, hair, or energy levels.  F/U HRT -she is doing well on her topical creams.  She did want to let me know that her periods have been a little bit longer in between them and the last period was very heavy she actually got her COVID booster while having her period and said it lasted a long time and she had some heavy bleeding with it.  Recent labs over the summer showed a low iron.  She has been taking a daily iron supplement.  She has been tolerating it well she says she will occasionally miss a dose but overall she is trying to be consistent with it she is taking 325 mg she is not vegetarian.  She also reports some pain in her left knee.  She said it notices it mostly with going downstairs and if she is been sitting for a long period of time and then gets up initially she will limp just a little bit she is hearing a lot of cracking and popping in that knee as well.  No recent injury.  Past Medical History:  Diagnosis Date   Fibrocystic breast    History of anxiety     Past Surgical History:  Procedure Laterality Date   NO PAST SURGERIES      Family History  Problem Relation Age of Onset   Heart attack Maternal Grandmother 28   Heart attack Father 53   Hyperlipidemia Father    Hypertension Father    Diabetes Father    Hyperlipidemia Mother    Hypertension Mother    Diabetes Mother    Migraines Mother     Social History   Socioeconomic History   Marital status: Married    Spouse name: Onalee Hua   Number of children: 2   Years of  education: college   Highest education level: Not on file  Occupational History   Occupation: Homemaker  Tobacco Use   Smoking status: Never   Smokeless tobacco: Never  Substance and Sexual Activity   Alcohol use: Yes    Comment: rarely   Drug use: No   Sexual activity: Yes    Partners: Male    Birth control/protection: Condom  Other Topics Concern   Not on file  Social History Narrative   Does cardio workout 30-45 minutes 3 days per week. No caffeine intake.   Social Determinants of Health   Financial Resource Strain: Not on file  Food Insecurity: Not on file  Transportation Needs: Not on file  Physical Activity: Not on file  Stress: Not on file  Social Connections: Not on file  Intimate Partner Violence: Not on file    Outpatient Medications Prior to Visit  Medication Sig Dispense Refill   AMBULATORY NON FORMULARY MEDICATION Medication Name: progesterone 10% (100 mg/mL). Apply 0.25 mL (1 click) daily at bedtime.  Fax: [phone] 24 mL 6   AMBULATORY NON FORMULARY MEDICATION Medication Name: Testosterone 2 mg/mL (0.2%) Cream. Apply 0.5 mL (2 clicks) ONCE daily as  directed  fax:[phone] 45 mL 5   cetirizine (ZYRTEC) 10 MG tablet Take 10 mg by mouth as needed for allergies.     cholecalciferol (VITAMIN D) 1000 units tablet Take 1,000 Units by mouth daily.     clotrimazole-betamethasone (LOTRISONE) cream Apply 1 application topically 2 (two) times daily. 45 g 0   fluticasone (FLONASE) 50 MCG/ACT nasal spray SHAKE AND INSTILL 2 SPRAYS IN EACH NOSTRIL EVERY DAY 16 g 11   IRON PO Take by mouth daily.     Magnesium 250 MG TABS Take 1 tablet by mouth at bedtime.     metroNIDAZOLE (METROGEL) 1 % gel Apply 1 application topically daily.     Multiple Vitamins-Minerals (MULTIVITAMIN WITH MINERALS) tablet Take 1 tablet by mouth daily.     rizatriptan (MAXALT-MLT) 10 MG disintegrating tablet Take 1 tablet (10 mg total) by mouth as needed for migraine. May repeat in 2 hours if  needed 10 tablet PRN   SOOLANTRA 1 % CREA APPLY THIN LAYER TO AFFECTED AREA ONCE DAILY AS NEEDED     SYNTHROID 125 MCG tablet TAKE 1 TABLET BY MOUTH DAILY BEFORE AND BREAKFAST 90 tablet 0   AMBULATORY NON FORMULARY MEDICATION Medication Name: testosterone 1mg /0.72ml.  Apply 0.19ml once daily 30 mL 6   AMBULATORY NON FORMULARY MEDICATION Medication Name: progesterone 50 mg per 0.5 mL cream. Apply 0.25 mL at bedtime. 30 mL 6   sulfamethoxazole-trimethoprim (BACTRIM DS) 800-160 MG tablet Take 1 tablet by mouth 2 (two) times daily. 6 tablet 0   No facility-administered medications prior to visit.    Allergies  Allergen Reactions   Citalopram Other (See Comments)    Felt numb all over    Fluoxetine Other (See Comments)    Insomnia   Zoloft [Sertraline Hcl] Other (See Comments)    Felt numb all over    ROS Review of Systems    Objective:    Physical Exam Constitutional:      Appearance: Normal appearance. She is well-developed.  HENT:     Head: Normocephalic and atraumatic.  Cardiovascular:     Rate and Rhythm: Normal rate and regular rhythm.     Heart sounds: Normal heart sounds.  Pulmonary:     Effort: Pulmonary effort is normal.  Musculoskeletal:     Comments: Left knee with palpable crepitus with flexion and extension.  Skin:    General: Skin is warm and dry.  Neurological:     Mental Status: She is alert and oriented to person, place, and time.  Psychiatric:        Behavior: Behavior normal.    BP 105/64   Pulse 74   Ht 5\' 4"  (1.626 m)   Wt 123 lb (55.8 kg)   LMP 08/18/2021 (Exact Date)   SpO2 99%   BMI 21.11 kg/m  Wt Readings from Last 3 Encounters:  08/27/21 123 lb (55.8 kg)  02/22/21 123 lb (55.8 kg)  08/24/20 122 lb (55.3 kg)     Health Maintenance Due  Topic Date Due   Hepatitis C Screening  Never done   COVID-19 Vaccine (5 - Booster for Pfizer series) 08/31/2021    There are no preventive care reminders to display for this patient.  Lab Results   Component Value Date   TSH 1.37 02/22/2021   Lab Results  Component Value Date   WBC 7.8 02/22/2021   HGB 12.3 02/22/2021   HCT 37.1 02/22/2021   MCV 91.4 02/22/2021   PLT 227 02/22/2021   Lab Results  Component Value Date   NA 137 02/22/2021   K 4.1 02/22/2021   CO2 28 02/22/2021   GLUCOSE 76 02/22/2021   BUN 16 02/22/2021   CREATININE 0.81 02/22/2021   BILITOT 0.3 02/22/2021   ALKPHOS 55 11/25/2016   AST 25 02/22/2021   ALT 21 02/22/2021   PROT 6.8 02/22/2021   ALBUMIN 4.3 11/25/2016   CALCIUM 9.2 02/22/2021   Lab Results  Component Value Date   CHOL 168 02/16/2020   Lab Results  Component Value Date   HDL 50 02/16/2020   Lab Results  Component Value Date   LDLCALC 109 02/16/2020   Lab Results  Component Value Date   TRIG 45 02/16/2020   Lab Results  Component Value Date   CHOLHDL 2.8 11/25/2016   No results found for: HGBA1C    Assessment & Plan:   Problem List Items Addressed This Visit       Cardiovascular and Mediastinum   Migraine with aura - Primary    Still uses ibuprofen for relief.  Okay to refill Maxalt if needed.        Endocrine   Hypothyroidism    Able on current regimen plan to recheck level in the spring.        Other   Low iron    Continue with daily iron supplement.  Handout given on iron rich foods.  We will recheck levels today and make adjustments to her regimen as needed.      Hormone replacement therapy (HRT)    Continue topical hormone therapy.  Follow-up in 6 months we will do labs at that time for hormone testing.      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  Encouraged her to do the exercises on her own at home.  If not improving over the next month encouraged to follow-up with Dr. Benjamin Stain our sports medicine provider.  No orders of the defined types were placed in  this encounter.   Follow-up: Return in about 6 months (around 02/24/2022) for Labs .    [name], MD

## 2021-08-27 NOTE — Assessment & Plan Note (Signed)
Continue with daily iron supplement.  Handout given on iron rich foods.  We will recheck levels today and make adjustments to her regimen as needed.

## 2021-08-27 NOTE — Assessment & Plan Note (Signed)
Continue topical hormone therapy.  Follow-up in 6 months we will do labs at that time for hormone testing.

## 2021-08-27 NOTE — Assessment & Plan Note (Signed)
Able on current regimen plan to recheck level in the spring.

## 2021-08-28 LAB — IRON,TIBC AND FERRITIN PANEL
%SAT: 23 % (calc) (ref 16–45)
Ferritin: 22 ng/mL (ref 16–232)
Iron: 71 ug/dL (ref 45–160)
TIBC: 306 mcg/dL (calc) (ref 250–450)

## 2021-08-28 NOTE — Progress Notes (Signed)
Hi Joann Wilson, your iron is coming up nicely.  Please continue with supplementation.  We want a try to get your ferritin greater than 40.

## 2021-09-10 ENCOUNTER — Other Ambulatory Visit: Payer: Self-pay | Admitting: Family Medicine

## 2021-09-10 DIAGNOSIS — R0981 Nasal congestion: Secondary | ICD-10-CM

## 2021-11-17 ENCOUNTER — Other Ambulatory Visit: Payer: Self-pay | Admitting: Family Medicine

## 2021-11-20 ENCOUNTER — Other Ambulatory Visit: Payer: Self-pay

## 2021-11-20 ENCOUNTER — Encounter: Payer: Self-pay | Admitting: Family Medicine

## 2021-11-20 MED ORDER — SYNTHROID 125 MCG PO TABS: ORAL_TABLET | ORAL | 0 refills | Status: DC

## 2021-11-20 NOTE — Telephone Encounter (Signed)
Resent for 90 days. Pls let her know to check later today

## 2021-12-07 ENCOUNTER — Telehealth (INDEPENDENT_AMBULATORY_CARE_PROVIDER_SITE_OTHER): Payer: BC Managed Care – PPO | Admitting: Family Medicine

## 2021-12-07 ENCOUNTER — Encounter: Payer: Self-pay | Admitting: Family Medicine

## 2021-12-07 DIAGNOSIS — J019 Acute sinusitis, unspecified: Secondary | ICD-10-CM | POA: Diagnosis not present

## 2021-12-07 MED ORDER — AZITHROMYCIN 250 MG PO TABS: ORAL_TABLET | ORAL | 0 refills | Status: AC

## 2021-12-07 NOTE — Progress Notes (Signed)
° ° °  Virtual Visit via Video Note  I connected with Joann Wilson on 12/07/21 at  1:00 PM EST by a video enabled telemedicine application and verified that I am speaking with the correct person using two identifiers.   I discussed the limitations of evaluation and management by telemedicine and the availability of in person appointments. The patient expressed understanding and agreed to proceed.  Patient location: at home Provider location: in office  Subjective:    CC:   Chief Complaint  Patient presents with   Head congestion    Facial pressure, nasal congestion, symptoms started over a week ago. Covid test negative last week.     HPI: Sxs started about 10 days ago. Facial pressure, nasal congestion, symptoms started over a week ago. Covid test negative last week. Started more in her chest and now in her sinuses. No fever, chills or rash.  Went to UC and given prednisone. Helped some.  Right sided facial pain and pressure.   She is using netti pot, zyrtec,  fluticasone.     Past medical history, Surgical history, Family history not pertinant except as noted below, Social history, Allergies, and medications have been entered into the medical record, reviewed, and corrections made.    Objective:    General: Speaking clearly in complete sentences without any shortness of breath.  Alert and oriented x3.  Normal judgment. No apparent acute distress.    Impression and Recommendations:    Problem List Items Addressed This Visit   None Visit Diagnoses     Acute non-recurrent sinusitis, unspecified location    -  Primary   Relevant Medications   azithromycin (ZITHROMAX) 250 MG tablet      Will tx with azithro if she is not better after weekend or if worse over the weekend pls start sooner. Continue symptomatic care. I suspect she is over the worse.   No orders of the defined types were placed in this encounter.   Meds ordered this encounter  Medications   azithromycin  (ZITHROMAX) 250 MG tablet    Sig: 2 Ttabs PO on Day 1, then one a day x 4 days.    Dispense:  6 tablet    Refill:  0     I discussed the assessment and treatment plan with the patient. The patient was provided an opportunity to ask questions and all were answered. The patient agreed with the plan and demonstrated an understanding of the instructions.   The patient was advised to call back or seek an in-person evaluation if the symptoms worsen or if the condition fails to improve as anticipated.  I spent 21 minutes on the day of the encounter to include pre-visit record review, face-to-face time with the patient and post visit ordering of test.   [name], MD

## 2022-02-13 ENCOUNTER — Other Ambulatory Visit: Payer: Self-pay | Admitting: Family Medicine

## 2022-02-13 DIAGNOSIS — Z1231 Encounter for screening mammogram for malignant neoplasm of breast: Secondary | ICD-10-CM

## 2022-02-13 LAB — LIPID PANEL
Cholesterol: 154 (ref 0–200)
HDL: 50 (ref 35–70)
LDL Cholesterol: 93
Triglycerides: 52 (ref 40–160)

## 2022-02-13 LAB — BASIC METABOLIC PANEL: Glucose: 76

## 2022-02-26 ENCOUNTER — Encounter: Payer: Self-pay | Admitting: Family Medicine

## 2022-02-26 ENCOUNTER — Other Ambulatory Visit (HOSPITAL_COMMUNITY)
Admission: RE | Admit: 2022-02-26 | Discharge: 2022-02-26 | Disposition: A | Discharge status: Home / Self Care | Payer: BC Managed Care – PPO | Source: Ambulatory Visit | Attending: Family Medicine | Admitting: Family Medicine

## 2022-02-26 ENCOUNTER — Ambulatory Visit (INDEPENDENT_AMBULATORY_CARE_PROVIDER_SITE_OTHER): Payer: BC Managed Care – PPO | Admitting: Family Medicine

## 2022-02-26 VITALS — BP 103/65 | HR 65 | Resp 16 | Ht 64.0 in | Wt 127.0 lb

## 2022-02-26 DIAGNOSIS — Z0000 Encounter for general adult medical examination without abnormal findings: Secondary | ICD-10-CM | POA: Diagnosis present

## 2022-02-26 MED ORDER — AMBULATORY NON FORMULARY MEDICATION: 6 refills | Status: DC

## 2022-02-26 MED ORDER — AMBULATORY NON FORMULARY MEDICATION: 5 refills | Status: DC

## 2022-02-26 NOTE — Progress Notes (Signed)
? ?Complete physical exam ? ?Patient: Joann Wilson   DOB: 06/11/1971   51 y.o. Female  MRN: [id number] ? ?Subjective:  ?  ?Chief Complaint  ?Patient presents with  ? Annual Exam  ?  Fasting   ? ? ?Joann Wilson is a 51 y.o. female who presents today for a complete physical exam. She reports consuming a  healthy  diet.  Stared back at the gym  She generally feels well. She reports sleeping well. She does not have additional problems to discuss today.   ? ?She is also concerned about family history of heart disease.  Dad diagnosed with CAD in his late 17s or early 12s.  Even though she herself does not have significant risk factors.  She did bring in some labs that she had done through her work including a lipid panel.  LDL was 93, HDL was 50 and total cholesterol was 154. ? ? ? ? ?Most recent fall risk assessment: ? ?  02/26/2022  ? 10:35 AM  ?Fall Risk   ?Falls in the past year? 0  ?Number falls in past yr: 0  ?Injury with Fall? 0  ?Risk for fall due to : No Fall Risks  ?Follow up Falls prevention discussed;Falls evaluation completed  ? ?  ?Most recent depression screenings: ? ?  02/26/2022  ? 10:36 AM 07/30/2021  ?  3:56 PM  ?PHQ 2/9 Scores  ?PHQ - 2 Score 0 0  ? ? ? ? ? ? ?Patient Care Team: ?Agapito Games, MD as PCP - General (Family Medicine)  ? ?Outpatient Medications Prior to Visit  ?Medication Sig  ? cetirizine (ZYRTEC) 10 MG tablet Take 10 mg by mouth as needed for allergies.  ? cholecalciferol (VITAMIN D) 1000 units tablet Take 1,000 Units by mouth daily.  ? clotrimazole-betamethasone (LOTRISONE) cream Apply 1 application topically 2 (two) times daily.  ? fluticasone (FLONASE) 50 MCG/ACT nasal spray SHAKE LIQUID WELL AND INSTILL 2 SPRAYS IN EACH NOSTRIL EVERY DAY  ? IRON PO Take by mouth daily.  ? Magnesium 250 MG TABS Take 1 tablet by mouth at bedtime.  ? metroNIDAZOLE (METROGEL) 1 % gel Apply 1 application topically daily.  ? Multiple Vitamins-Minerals (MULTIVITAMIN WITH MINERALS) tablet Take 1  tablet by mouth daily.  ? rizatriptan (MAXALT-MLT) 10 MG disintegrating tablet Take 1 tablet (10 mg total) by mouth as needed for migraine. May repeat in 2 hours if needed  ? SOOLANTRA 1 % CREA APPLY THIN LAYER TO AFFECTED AREA ONCE DAILY AS NEEDED  ? SYNTHROID 125 MCG tablet TAKE 1 TABLET BY MOUTH DAILY BEFORE AND BREAKFAST  ? [DISCONTINUED] AMBULATORY NON FORMULARY MEDICATION Medication Name: progesterone 10% (100 mg/mL). Apply 0.25 mL (1 click) daily at bedtime. ? ?Fax: [phone]  ? [DISCONTINUED] AMBULATORY NON FORMULARY MEDICATION Medication Name: Testosterone 2 mg/mL (0.2%) Cream. ?Apply 0.5 mL (2 clicks) ONCE daily as directed  [phone]  ? ?No facility-administered medications prior to visit.  ? ? ?ROS ? ? ? ? ?   ?Objective:  ? ?  ?BP 103/65   Pulse 65   Resp 16   Ht 5\' 4"  (1.626 m)   Wt 127 lb (57.6 kg)   LMP 02/16/2022   SpO2 100%   BMI 21.80 kg/m?  ? ? ?Physical Exam ?Vitals reviewed.  ?Constitutional:   ?   Appearance: She is well-developed.  ?HENT:  ?   Head: Normocephalic and atraumatic.  ?   Right Ear: External ear normal.  ?   Left  Ear: External ear normal.  ?   Nose: Nose normal.  ?Eyes:  ?   Conjunctiva/sclera: Conjunctivae normal.  ?   Pupils: Pupils are equal, round, and reactive to light.  ?Neck:  ?   Thyroid: No thyromegaly.  ?Cardiovascular:  ?   Rate and Rhythm: Normal rate and regular rhythm.  ?   Heart sounds: Normal heart sounds.  ?Pulmonary:  ?   Effort: Pulmonary effort is normal.  ?   Breath sounds: Normal breath sounds. No wheezing.  ?Musculoskeletal:  ?   Cervical back: Neck supple.  ?Lymphadenopathy:  ?   Cervical: No cervical adenopathy.  ?Skin: ?   General: Skin is warm and dry.  ?   Coloration: Skin is not pale.  ?Neurological:  ?   Mental Status: She is alert and oriented to person, place, and time.  ?Psychiatric:     ?   Behavior: Behavior normal.  ?  ? ?No results found for any visits on 02/26/22. ? ?   ?Assessment & Plan:  ?  ?Routine Health Maintenance and  Physical Exam ? ?Immunization History  ?Administered Date(s) Administered  ? Influenza Split 06/14/2012, 07/06/2021  ? Influenza Whole 07/13/2009, 07/05/2010  ? Influenza, Seasonal, Injecte, Preservative Fre 07/14/2014  ? Influenza,inj,Quad PF,6+ Mos 06/25/2013, 06/19/2017, 06/09/2018, 07/01/2019, 08/10/2020  ? Influenza-Unspecified 07/15/2015, 07/25/2016  ? PFIZER(Purple Top)SARS-COV-2 Vaccination 12/24/2019, 01/19/2020, 09/03/2020, 07/06/2021  ? Td 04/07/2009  ? Tdap 03/17/2019  ? Zoster Recombinat (Shingrix) 03/17/2020, 05/24/2020  ? ? ?Health Maintenance  ?Topic Date Due  ? Hepatitis C Screening  Never done  ? COVID-19 Vaccine (5 - Booster for Pfizer series) 03/14/2022 (Originally 08/31/2021)  ? INFLUENZA VACCINE  05/14/2022  ? MAMMOGRAM  08/11/2023  ? PAP SMEAR-Modifier  03/16/2024  ? COLONOSCOPY (Pts 45-40yrs Insurance coverage will need to be confirmed)  05/13/2027  ? TETANUS/TDAP  03/16/2029  ? HIV Screening  Completed  ? Zoster Vaccines- Shingrix  Completed  ? Pneumococcal Vaccine 31-36 Years old  Aged Out  ? HPV VACCINES  Aged Out  ? ? ?Discussed health benefits of physical activity, and encouraged her to engage in regular exercise appropriate for her age and condition. ? ?Problem List Items Addressed This Visit   ?None ?Visit Diagnoses   ? ? Wellness examination    -  Primary  ? Relevant Orders  ? Cytology - PAP  ? TSH  ? Fe+TIBC+Fer  ? COMPLETE METABOLIC PANEL WITH GFR  ? ?  ? ?Keep up a regular exercise program and make sure you are eating a healthy diet ?Try to eat 4 servings of dairy a day, or if you are lactose intolerant take a calcium with vitamin D daily.  ?Your vaccines are up to date.  ?We will abstract cholesterol levels and get additional labs today. ?Lan to recheck iron levels as well. ? ? ?Return in about 6 months (around 08/29/2022) for medicaiton follow up . ? ?  ? ?Joann Gasser, MD ? ? ?

## 2022-02-27 ENCOUNTER — Encounter: Payer: Self-pay | Admitting: Family Medicine

## 2022-02-27 LAB — COMPLETE METABOLIC PANEL WITH GFR
AG Ratio: 1.9 (calc) (ref 1.0–2.5)
ALT: 24 U/L (ref 6–29)
AST: 26 U/L (ref 10–35)
Albumin: 4.3 g/dL (ref 3.6–5.1)
Alkaline phosphatase (APISO): 55 U/L (ref 37–153)
BUN: 15 mg/dL (ref 7–25)
CO2: 26 mmol/L (ref 20–32)
Calcium: 9 mg/dL (ref 8.6–10.4)
Chloride: 104 mmol/L (ref 98–110)
Creat: 0.89 mg/dL (ref 0.50–1.03)
Globulin: 2.3 g/dL (calc) (ref 1.9–3.7)
Glucose, Bld: 73 mg/dL (ref 65–99)
Potassium: 4.1 mmol/L (ref 3.5–5.3)
Sodium: 139 mmol/L (ref 135–146)
Total Bilirubin: 0.4 mg/dL (ref 0.2–1.2)
Total Protein: 6.6 g/dL (ref 6.1–8.1)
eGFR: 78 mL/min/{1.73_m2} (ref >=60)

## 2022-02-27 LAB — IRON,TIBC AND FERRITIN PANEL
%SAT: 27 % (calc) (ref 16–45)
Ferritin: 23 ng/mL (ref 16–232)
Iron: 89 ug/dL (ref 45–160)
TIBC: 333 mcg/dL (calc) (ref 250–450)

## 2022-02-27 LAB — TSH: TSH: 0.49 mIU/L

## 2022-02-27 NOTE — Progress Notes (Signed)
Hi Joann Wilson, ?Your TSH is a little on the lower end at 0.4.  I do want to keep a closer eye on this and instead of checking in 1 year I want to check it again in 6 months.  Normally you run more between 1.3-2.0.  Again nothing worrisome but instead of waiting a year I would like to check it again in 6 months.  Your iron levels are still a little bit on the low end we really like to see the ferritin greater than 40.  But total iron counts okay.  Just continue to try to take extra iron or eat extra iron in your foods.  Your metabolic panel including liver and kidney function is normal.

## 2022-03-01 LAB — CYTOLOGY - PAP
Comment: NEGATIVE
Diagnosis: NEGATIVE
High risk HPV: NEGATIVE

## 2022-03-01 MED ORDER — SYNTHROID 125 MCG PO TABS: ORAL_TABLET | ORAL | 0 refills | Status: DC

## 2022-03-01 MED ORDER — SYNTHROID 100 MCG PO TABS: 100.0000 ug | ORAL_TABLET | ORAL | 1 refills | Status: DC

## 2022-03-01 NOTE — Addendum Note (Signed)
Addended by: Beatrice Lecher D on: 03/01/2022 08:50 AM   Modules accepted: Orders

## 2022-03-01 NOTE — Progress Notes (Signed)
Your Pap smear is normal. You are negative for HPV as well. Repeat pap smear in 5 years.

## 2022-03-02 ENCOUNTER — Other Ambulatory Visit: Payer: Self-pay

## 2022-03-02 ENCOUNTER — Encounter: Payer: Self-pay | Admitting: Family Medicine

## 2022-03-02 ENCOUNTER — Emergency Department (INDEPENDENT_AMBULATORY_CARE_PROVIDER_SITE_OTHER)
Admission: EM | Admit: 2022-03-02 | Discharge: 2022-03-02 | Disposition: A | Discharge status: Home / Self Care | Payer: BC Managed Care – PPO | Source: Home / Self Care

## 2022-03-02 ENCOUNTER — Other Ambulatory Visit (HOSPITAL_COMMUNITY)
Admission: RE | Admit: 2022-03-02 | Discharge: 2022-03-02 | Disposition: A | Discharge status: Home / Self Care | Payer: BC Managed Care – PPO | Source: Ambulatory Visit | Attending: Family Medicine | Admitting: Family Medicine

## 2022-03-02 DIAGNOSIS — N898 Other specified noninflammatory disorders of vagina: Secondary | ICD-10-CM | POA: Diagnosis present

## 2022-03-02 DIAGNOSIS — N3001 Acute cystitis with hematuria: Secondary | ICD-10-CM

## 2022-03-02 LAB — POCT URINALYSIS DIP (MANUAL ENTRY)
Bilirubin, UA: NEGATIVE
Glucose, UA: NEGATIVE mg/dL
Ketones, POC UA: NEGATIVE mg/dL
Nitrite, UA: NEGATIVE
Protein Ur, POC: NEGATIVE mg/dL
Spec Grav, UA: <=1.005 — AB (ref 1.010–1.025)
Urobilinogen, UA: 0.2 E.U./dL
pH, UA: 6 (ref 5.0–8.0)

## 2022-03-02 MED ORDER — SULFAMETHOXAZOLE-TRIMETHOPRIM 800-160 MG PO TABS: 1.0000 | ORAL_TABLET | Freq: Two times a day (BID) | ORAL | 0 refills | Status: AC

## 2022-03-02 MED ORDER — FLUCONAZOLE 150 MG PO TABS: ORAL_TABLET | ORAL | 0 refills | Status: DC

## 2022-03-02 NOTE — ED Triage Notes (Signed)
Pt states she had a pap smear on Tuesday and since then has felt vaginal itching and discomfort. She has not tried any OTC things for this.

## 2022-03-02 NOTE — ED Provider Notes (Signed)
[facility]    CSN: [id number] Arrival date & time: 03/02/22  1448      History   Chief Complaint Chief Complaint  Patient presents with   Vaginal Itching    HPI Joann Wilson is a 51 y.o. female.   HPI 51 year old female presents with vaginal discharge for days.  PMH significant for anorexia nervosa, migraine with aura, and hypothyroidism.  Past Medical History:  Diagnosis Date   Fibrocystic breast    History of anxiety     Patient Active Problem List   Diagnosis Date Noted   History of adenomatous polyp of colon 05/12/2020   Hormone replacement therapy (HRT) 08/18/2019   Vitamin D deficiency 04/09/2016   Low iron 04/09/2016   Skin lump of arm 02/15/2015   GAD (generalized anxiety disorder) 02/28/2014   Hypothyroidism 03/31/2009   History of anorexia nervosa 03/31/2009   Migraine with aura 03/31/2009   Allergic rhinitis 03/31/2009   BACK PAIN, LUMBAR, CHRONIC 03/31/2009   FIBROCYSTIC BREAST DISEASE, HX OF 03/31/2009    Past Surgical History:  Procedure Laterality Date   NO PAST SURGERIES      OB History   No obstetric history on file.      Home Medications    Prior to Admission medications   Medication Sig Start Date End Date Taking? Authorizing Provider  fluconazole (DIFLUCAN) 150 MG tablet Take 1 tab p.o. for vaginal candidiasis, may repeat 1 tab p.o. in 3 days if symptoms are not resolved. 03/02/22  Yes Trevor Ihaagan, Kitana Gage, FNP  sulfamethoxazole-trimethoprim (BACTRIM DS) 800-160 MG tablet Take 1 tablet by mouth 2 (two) times daily for 7 days. 03/02/22 03/09/22 Yes Trevor Ihaagan, Kallyn Demarcus, FNP  AMBULATORY NON FORMULARY MEDICATION Medication Name: progesterone 10% (100 mg/mL). Apply 0.25 mL (1 click) daily at bedtime.  Fax: [phone] 02/26/22   Agapito GamesMetheney, Catherine D, MD  AMBULATORY NON FORMULARY MEDICATION Medication Name: Testosterone 2 mg/mL (0.2%) Cream. Apply 0.5 mL (2 clicks) ONCE daily as directed  fax:[phone] 02/26/22   Agapito GamesMetheney, Catherine D,  MD  cetirizine (ZYRTEC) 10 MG tablet Take 10 mg by mouth as needed for allergies.    [provider]  cholecalciferol (VITAMIN D) 1000 units tablet Take 1,000 Units by mouth daily.    [provider]  clotrimazole-betamethasone (LOTRISONE) cream Apply 1 application topically 2 (two) times daily. 04/08/20   Christen ButterJessup, Joy, NP  fluticasone (FLONASE) 50 MCG/ACT nasal spray SHAKE LIQUID WELL AND INSTILL 2 SPRAYS IN EACH NOSTRIL EVERY DAY 09/12/21   Agapito GamesMetheney, Catherine D, MD  IRON PO Take by mouth daily.    [provider]  Magnesium 250 MG TABS Take 1 tablet by mouth at bedtime.    [provider]  metroNIDAZOLE (METROGEL) 1 % gel Apply 1 application topically daily.    [provider]  Multiple Vitamins-Minerals (MULTIVITAMIN WITH MINERALS) tablet Take 1 tablet by mouth daily.    [provider]  rizatriptan (MAXALT-MLT) 10 MG disintegrating tablet Take 1 tablet (10 mg total) by mouth as needed for migraine. May repeat in 2 hours if needed 08/24/20   Agapito GamesMetheney, Catherine D, MD  SOOLANTRA 1 % CREA APPLY THIN LAYER TO AFFECTED AREA ONCE DAILY AS NEEDED 12/03/19   [provider]  SYNTHROID 100 MCG tablet Take 1 tablet (100 mcg total) by mouth once a week. 03/01/22   Agapito GamesMetheney, Catherine D, MD  SYNTHROID 125 MCG tablet TAKE 1 TABLET BY MOUTH DAILY BEFORE AND BREAKFAST 03/01/22   Agapito GamesMetheney, Catherine D, MD  Family History Family History  Problem Relation Age of Onset   Hyperlipidemia Mother    Hypertension Mother    Diabetes Mother    Migraines Mother    Heart attack Father 3   Hyperlipidemia Father    Hypertension Father    Diabetes Father    Heart attack Maternal Grandmother 15    Social History Social History   Tobacco Use   Smoking status: Never   Smokeless tobacco: Never  Substance Use Topics   Alcohol use: Not Currently   Drug use: No     Allergies   Citalopram, Fluoxetine, and Zoloft [sertraline hcl]   Review of  Systems Review of Systems  Genitourinary:  Positive for vaginal discharge.  All other systems reviewed and are negative.   Physical Exam Triage Vital Signs ED Triage Vitals  Enc Vitals Group     BP      Pulse      Resp      Temp      Temp src      SpO2      Weight      Height      Head Circumference      Peak Flow      Pain Score      Pain Loc      Pain Edu?      Excl. in GC?    No data found.  Updated Vital Signs BP 119/76 (BP Location: Right Arm)   Pulse 83   Temp 98.2 F (36.8 C) (Oral)   Resp 16   LMP 02/16/2022   SpO2 99%      Physical Exam Vitals and nursing note reviewed.  Constitutional:      General: She is not in acute distress.    Appearance: Normal appearance. She is normal weight. She is not ill-appearing.  HENT:     Head: Normocephalic and atraumatic.     Mouth/Throat:     Mouth: Mucous membranes are moist.     Pharynx: Oropharynx is clear.  Eyes:     Extraocular Movements: Extraocular movements intact.     Conjunctiva/sclera: Conjunctivae normal.     Pupils: Pupils are equal, round, and reactive to light.  Cardiovascular:     Rate and Rhythm: Normal rate and regular rhythm.     Pulses: Normal pulses.     Heart sounds: Normal heart sounds. No murmur heard. Pulmonary:     Effort: Pulmonary effort is normal.     Breath sounds: Normal breath sounds. No wheezing, rhonchi or rales.  Abdominal:     Tenderness: There is no right CVA tenderness or left CVA tenderness.  Musculoskeletal:     Cervical back: Normal range of motion and neck supple.  Skin:    General: Skin is warm and dry.  Neurological:     General: No focal deficit present.     Mental Status: She is alert and oriented to person, place, and time.     UC Treatments / Results  Labs (all labs ordered are listed, but only abnormal results are displayed) Labs Reviewed  POCT URINALYSIS DIP (MANUAL ENTRY) - Abnormal; Notable for the following components:      Result Value   Spec  Grav, UA <=1.005 (*)    Blood, UA moderate (*)    Leukocytes, UA Trace (*)    All other components within normal limits  URINE CULTURE  CERVICOVAGINAL ANCILLARY ONLY    EKG   Radiology No results found.  Procedures Procedures (  including critical care time)  Medications Ordered in UC Medications - No data to display  Initial Impression / Assessment and Plan / UC Course  I have reviewed the triage vital signs and the nursing notes.  Pertinent labs & imaging results that were available during my care of the patient were reviewed by me and considered in my medical decision making (see chart for details).     MDM: 1.  Acute cystitis with hematuria-Rx'd Bactrim; 2.  Vaginal itching-Rx'd Diflucan Aptima swab ordered. Instructed patient to take medication as directed with food to completion.  Encouraged patient to increase daily water intake while taking these medications.  Advised patient we will follow-up with her urine culture and Aptima swab results once received.  Patient discharged home, hemodynamically stable. Final Clinical Impressions(s) / UC Diagnoses   Final diagnoses:  Acute cystitis with hematuria  Vaginal itching     Discharge Instructions      Instructed patient to take medication as directed with food to completion.  Encouraged patient to increase daily water intake while taking these medications.  Advised patient we will follow-up with her urine culture and Aptima swab results once received.     ED Prescriptions     Medication Sig Dispense Auth. Provider   sulfamethoxazole-trimethoprim (BACTRIM DS) 800-160 MG tablet Take 1 tablet by mouth 2 (two) times daily for 7 days. 14 tablet Trevor Iha, FNP   fluconazole (DIFLUCAN) 150 MG tablet Take 1 tab p.o. for vaginal candidiasis, may repeat 1 tab p.o. in 3 days if symptoms are not resolved. 5 tablet Trevor Iha, FNP      PDMP not reviewed this encounter.   Trevor Iha, FNP 03/02/22 1538

## 2022-03-02 NOTE — Discharge Instructions (Addendum)
Instructed patient to take medication as directed with food to completion.  Encouraged patient to increase daily water intake while taking these medications.  Advised patient we will follow-up with her urine culture and Aptima swab results once received.

## 2022-03-04 LAB — URINE CULTURE
MICRO NUMBER:: 13425217
SPECIMEN QUALITY:: ADEQUATE

## 2022-03-04 LAB — CERVICOVAGINAL ANCILLARY ONLY
Bacterial Vaginitis (gardnerella): NEGATIVE
Candida Glabrata: NEGATIVE
Candida Vaginitis: NEGATIVE
Comment: NEGATIVE
Comment: NEGATIVE
Comment: NEGATIVE

## 2022-03-05 ENCOUNTER — Telehealth: Payer: Self-pay

## 2022-03-05 NOTE — Telephone Encounter (Signed)
Pt states that she needed clarification of lab results. Pt was confused about urine culture results. She was told that her culture was negative. Pt could have wiped the wrong way while trying to get clean catch which may be the reason there was mixed flora. Pt was told to finish up antibiotic. LM

## 2022-03-08 ENCOUNTER — Ambulatory Visit (INDEPENDENT_AMBULATORY_CARE_PROVIDER_SITE_OTHER): Payer: BC Managed Care – PPO | Admitting: Family Medicine

## 2022-03-08 ENCOUNTER — Encounter: Payer: Self-pay | Admitting: Family Medicine

## 2022-03-08 VITALS — BP 112/83 | HR 75 | Resp 16 | Ht 64.0 in | Wt 125.0 lb

## 2022-03-08 DIAGNOSIS — R1030 Lower abdominal pain, unspecified: Secondary | ICD-10-CM

## 2022-03-08 DIAGNOSIS — R102 Pelvic and perineal pain: Secondary | ICD-10-CM | POA: Diagnosis not present

## 2022-03-08 NOTE — Progress Notes (Signed)
Acute Office Visit  Subjective:     Patient ID: Joann Wilson, female    DOB: 09/04/1971, 51 y.o.   MRN: [id number]  Chief Complaint  Patient presents with   Abdominal Pressure    Patient states she has been having lower abdominal pressure/cramps for 1 week. Patient was seen at urgent care and was given Bactrim and Diflucan. Patient stated she is still having some symptoms.     HPI Patient is in today for Patient states she has been having lower abdominal pressure/cramps for 1 week.  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  She called the on-call nurse and they recommended that if it did not go away after 24 hours to please be evaluated.  Patient was seen at urgent care on May 20 about 6 days ago and was given Bactrim and Diflucan. Patient stated she is still having some symptoms.  Urine culture came back negative just showing mixed genital flora.  Aptima swab also came back negative for yeast and BV.  She has been taking the Bactrim DS they had placed her on it for 7 days but she decided to go ahead and stop that last night so has not taken a dose today.  She most wonders if the antibiotic might also be upsetting her stomach as now she is having more lower abdominal pressure and pain.  ROS      Objective:    BP 112/83   Pulse 75   Resp 16   Ht 5\' 4"  (1.626 m)   Wt 125 lb (56.7 kg)   LMP 02/16/2022   SpO2 100%   BMI 21.46 kg/m    Physical Exam Vitals reviewed. Exam conducted with a chaperone present.  Constitutional:      Appearance: She is well-developed.  HENT:     Head: Normocephalic and atraumatic.  Eyes:     Conjunctiva/sclera: Conjunctivae normal.  Cardiovascular:     Rate and Rhythm: Normal rate.  Pulmonary:     Effort: Pulmonary effort is normal.  Abdominal:     General: There is no distension.     Palpations: Abdomen is soft.     Tenderness: There is  no abdominal tenderness. There is no guarding.  Genitourinary:    Labia:        Right: No rash.        Left: No rash.      Vagina: Normal.     Cervix: Normal.  Skin:    General: Skin is dry.     Coloration: Skin is not pale.  Neurological:     Mental Status: She is alert and oriented to person, place, and time.  Psychiatric:        Behavior: Behavior normal.    No results found for any visits on 03/08/22.      Assessment & Plan:   Problem List Items Addressed This Visit   None Visit Diagnoses     Lower abdominal pain    -  Primary   Pelvic pressure in female           Just keep increasing the fiber and drinking plenty of water over the weekend.  See if bowels are moving more consistently and even just coming off the antibiotic you feel better.  We discussed that if she does not feel better after the long weekend to give 03/10/22 a call back and we  can consider further work-up with abdominal and pelvic ultrasound.  No orders of the defined types were placed in this encounter.   No follow-ups on file.  [name], MD

## 2022-03-08 NOTE — Patient Instructions (Signed)
Just keep increasing the fiber and drinking plenty of water over the weekend.  See if bowels are moving more consistently and even just coming off the antibiotic you feel better.

## 2022-04-19 ENCOUNTER — Encounter: Payer: Self-pay | Admitting: Family Medicine

## 2022-04-19 DIAGNOSIS — E038 Other specified hypothyroidism: Secondary | ICD-10-CM

## 2022-04-19 DIAGNOSIS — Z79890 Hormone replacement therapy (postmenopausal): Secondary | ICD-10-CM

## 2022-04-19 NOTE — Telephone Encounter (Signed)
Orders Placed This Encounter  Procedures   Estradiol   Follicle stimulating hormone   Luteinizing hormone   Progesterone   TSH

## 2022-04-23 LAB — TSH: TSH: 0.87 mIU/L

## 2022-04-23 LAB — PROGESTERONE: Progesterone: 0.7 ng/mL

## 2022-04-23 LAB — FOLLICLE STIMULATING HORMONE: FSH: 28.3 m[IU]/mL

## 2022-04-23 LAB — ESTRADIOL: Estradiol: 180 pg/mL

## 2022-04-23 LAB — LUTEINIZING HORMONE: LH: 62.3 m[IU]/mL

## 2022-04-23 NOTE — Progress Notes (Signed)
Hi Joann Wilson, Thyroid looks good.  Interestingly her estrogen level was up a bit.  It should be and usually it is a lot lower than that.  I would like to recheck that again in about 6 weeks.  But everything else looks stable and good.

## 2022-04-23 NOTE — Progress Notes (Signed)
Lets just start with repeating the labs and then go from there.  Because if it goes back down, then we can go back to normal monitoring.  If it stays elevated then we can talk about it from there may be even make a "telephone visit" to go over the results if needed.

## 2022-04-24 ENCOUNTER — Ambulatory Visit (INDEPENDENT_AMBULATORY_CARE_PROVIDER_SITE_OTHER): Payer: BC Managed Care – PPO | Admitting: Sports Medicine

## 2022-04-24 ENCOUNTER — Encounter: Payer: Self-pay | Admitting: Sports Medicine

## 2022-04-24 DIAGNOSIS — N760 Acute vaginitis: Secondary | ICD-10-CM | POA: Diagnosis not present

## 2022-04-24 LAB — POCT URINALYSIS DIP (CLINITEK)
Bilirubin, UA: NEGATIVE
Glucose, UA: NEGATIVE mg/dL
Ketones, POC UA: NEGATIVE mg/dL
Leukocytes, UA: NEGATIVE
Nitrite, UA: NEGATIVE
POC PROTEIN,UA: NEGATIVE
Spec Grav, UA: 1.015 (ref 1.010–1.025)
Urobilinogen, UA: 0.2 E.U./dL
pH, UA: 7 (ref 5.0–8.0)

## 2022-04-24 LAB — WET PREP FOR TRICH, YEAST, CLUE
MICRO NUMBER:: 13636993
Specimen Quality: ADEQUATE

## 2022-04-24 MED ORDER — MELOXICAM 15 MG PO TABS: ORAL_TABLET | ORAL | 3 refills | Status: DC

## 2022-04-24 NOTE — Assessment & Plan Note (Signed)
This is a very pleasant 51 year old female, she is still menstruating.  For the past several weeks she has had increasing discomfort in her lower vagina, described as a raw sensation. She was seen in urgent care, urinalysis had some leukocytes and blood, cultures were negative. Sounds like she was treated with antibiotics and Diflucan. Had a temporary improvement. Unfortunately having recurrence of vaginal discomfort, she does have a whitish discharge that she feels is similar to her typical physiologic discharge. She is currently midcycle. She is here for further evaluation. Symptoms are similar, vaginal rawness, full sensation, physiologic type discharge. No overt urinary urgency, burning, frequency. Urinalysis was positive for blood without leukocytes and will be sent off for culture. I did perform a pelvic exam today with a female chaperone, vulva appeared normal, the lower vault did have what appeared to be a fairly large shallow ulcer. She did have a light milky type discharge that I obtained for wet prep. No tenderness to palpation.   No obvious bladder prolapse. She had a somewhat anterior cervix that appeared unremarkable. Adding meloxicam for the pain, I would like Dr. Penne Lash to weigh in and about 4 to 6 weeks, and we will await her swabs.

## 2022-04-24 NOTE — Progress Notes (Signed)
    Procedures performed today:    None.  Independent interpretation of notes and tests performed by another provider:   None.  Brief History, Exam, Impression, and Recommendations:    Vaginitis This is a very pleasant 51 year old female, she is still menstruating.  For the past several weeks she has had increasing discomfort in her lower vagina, described as a raw sensation. She was seen in urgent care, urinalysis had some leukocytes and blood, cultures were negative. Sounds like she was treated with antibiotics and Diflucan. Had a temporary improvement. Unfortunately having recurrence of vaginal discomfort, she does have a whitish discharge that she feels is similar to her typical physiologic discharge. She is currently midcycle. She is here for further evaluation. Symptoms are similar, vaginal rawness, full sensation, physiologic type discharge. No overt urinary urgency, burning, frequency. Urinalysis was positive for blood without leukocytes and will be sent off for culture. I did perform a pelvic exam today with a female chaperone, vulva appeared normal, the lower vault did have what appeared to be a fairly large shallow ulcer. She did have a light milky type discharge that I obtained for wet prep. No tenderness to palpation.   No obvious bladder prolapse. She had a somewhat anterior cervix that appeared unremarkable. Adding meloxicam for the pain, I would like Dr. Penne Lash to weigh in and about 4 to 6 weeks, and we will await her swabs.    ____________________________________________ Joann Wilson. Benjamin Stain, M.D., ABFM., CAQSM., AME. Primary Care and Sports Medicine [facility]  Adjunct Professor of Family Medicine  [employer]  Restaurant manager, fast food

## 2022-04-26 ENCOUNTER — Encounter: Payer: Self-pay | Admitting: Family Medicine

## 2022-04-26 ENCOUNTER — Other Ambulatory Visit: Payer: Self-pay | Admitting: Family Medicine

## 2022-04-26 LAB — URINE CULTURE
MICRO NUMBER:: 13640309
Result:: NO GROWTH
SPECIMEN QUALITY:: ADEQUATE

## 2022-04-26 MED ORDER — SYNTHROID 100 MCG PO TABS
100.0000 ug | ORAL_TABLET | ORAL | 1 refills | Status: DC
Start: 2022-04-26 — End: 2023-01-13

## 2022-04-26 MED ORDER — SYNTHROID 125 MCG PO TABS: ORAL_TABLET | ORAL | 1 refills | Status: DC

## 2022-05-31 ENCOUNTER — Encounter: Payer: Self-pay | Admitting: Family Medicine

## 2022-05-31 DIAGNOSIS — Z79890 Hormone replacement therapy (postmenopausal): Secondary | ICD-10-CM

## 2022-06-10 ENCOUNTER — Encounter: Payer: Self-pay | Admitting: Obstetrics & Gynecology

## 2022-06-10 ENCOUNTER — Ambulatory Visit (INDEPENDENT_AMBULATORY_CARE_PROVIDER_SITE_OTHER): Payer: BC Managed Care – PPO | Admitting: Obstetrics & Gynecology

## 2022-06-10 ENCOUNTER — Other Ambulatory Visit (HOSPITAL_COMMUNITY)
Admission: RE | Admit: 2022-06-10 | Discharge: 2022-06-10 | Disposition: A | Discharge status: Home / Self Care | Payer: BC Managed Care – PPO | Source: Ambulatory Visit | Attending: Obstetrics & Gynecology | Admitting: Obstetrics & Gynecology

## 2022-06-10 VITALS — BP 124/76 | HR 78 | Ht 64.0 in | Wt 123.0 lb

## 2022-06-10 DIAGNOSIS — N898 Other specified noninflammatory disorders of vagina: Secondary | ICD-10-CM

## 2022-06-10 NOTE — Progress Notes (Signed)
   Subjective:    Patient ID: Joann Wilson, female    DOB: 01/09/1971, 51 y.o.   MRN: [id number]  HPI  51 yo female presents for f/u of vaginal irritation.  I reviewed the note from Dr. Karie Schwalbe on 04/24/22.  It was thought to be from aggressive cleaning; however she has ben careful with hygiene for several weeks and the discomfort is still present.  Pt is having menstrual cycles, irritable with wiping and intercourse.  Pt not using any new hygiene products.  She does not douche.   Review of Systems  Constitutional: Negative.   Respiratory: Negative.    Cardiovascular: Negative.   Gastrointestinal: Negative.   Genitourinary:  Positive for dyspareunia and vaginal pain.  Psychiatric/Behavioral: Negative.        Objective:   Physical Exam Vitals reviewed.  Constitutional:      General: She is not in acute distress.    Appearance: She is well-developed.  HENT:     Head: Normocephalic and atraumatic.  Eyes:     Conjunctiva/sclera: Conjunctivae normal.  Cardiovascular:     Rate and Rhythm: Normal rate.  Pulmonary:     Effort: Pulmonary effort is normal.  Genitourinary:    Vagina: No vaginal discharge.       Comments: Rest of vaginal vault is well estrogenized, no lesion, no discharge Cervix is without lesion or bleeding. Skin:    General: Skin is warm and dry.  Neurological:     Mental Status: She is alert and oriented to person, place, and time.  Psychiatric:        Mood and Affect: Mood normal.    Vitals:   06/10/22 1025  BP: 124/76  Pulse: 78  Weight: 123 lb (55.8 kg)  Height: 5\' 4"  (1.626 m)      Assessment & Plan:  51 yo female with burning and redness at introitus  Still menstruating and last estradiol was 136 (random level, not Day 3) Check Aptima Reviewed good vulvar hygiene RTC for biopsy if Aptima is negative.  Mammogram due October 2023 Last pap in 2023 nml co testing.

## 2022-06-11 LAB — CERVICOVAGINAL ANCILLARY ONLY
Bacterial Vaginitis (gardnerella): NEGATIVE
Candida Glabrata: NEGATIVE
Candida Vaginitis: NEGATIVE
Comment: NEGATIVE
Comment: NEGATIVE
Comment: NEGATIVE

## 2022-06-11 NOTE — Progress Notes (Signed)
Pls call the lab and see if we can add a progesterone level as well.

## 2022-06-12 LAB — ESTRADIOL: Estradiol: 136 pg/mL

## 2022-06-12 LAB — TEST AUTHORIZATION 2

## 2022-06-12 LAB — PROGESTERONE: Progesterone: 0.6 ng/mL

## 2022-06-13 NOTE — Progress Notes (Signed)
Hi Magalie, estradiol level and progesterone look good.  No concerning levels.

## 2022-06-19 ENCOUNTER — Telehealth: Payer: Self-pay | Admitting: *Deleted

## 2022-06-19 NOTE — Telephone Encounter (Signed)
Patient scheduled vulvar biopsy for 07/15/2022 with Dr. Penne Lash. Patient is requesting information on the process of the vulvar biopsy to see if she will need to change her appointment date due to an event that she needs to attend on 07/15/22. Please send information to patient's MyChart.

## 2022-06-30 ENCOUNTER — Encounter: Payer: Self-pay | Admitting: Family Medicine

## 2022-07-15 ENCOUNTER — Other Ambulatory Visit (HOSPITAL_COMMUNITY)
Admission: RE | Admit: 2022-07-15 | Discharge: 2022-07-15 | Disposition: A | Discharge status: Home / Self Care | Payer: BC Managed Care – PPO | Source: Ambulatory Visit | Attending: Obstetrics & Gynecology | Admitting: Obstetrics & Gynecology

## 2022-07-15 ENCOUNTER — Encounter: Payer: Self-pay | Admitting: Obstetrics & Gynecology

## 2022-07-15 ENCOUNTER — Ambulatory Visit (INDEPENDENT_AMBULATORY_CARE_PROVIDER_SITE_OTHER): Payer: BC Managed Care – PPO | Admitting: Obstetrics & Gynecology

## 2022-07-15 VITALS — BP 109/67 | HR 91 | Ht 64.0 in | Wt 124.0 lb

## 2022-07-15 DIAGNOSIS — R399 Unspecified symptoms and signs involving the genitourinary system: Secondary | ICD-10-CM

## 2022-07-15 DIAGNOSIS — N9089 Other specified noninflammatory disorders of vulva and perineum: Secondary | ICD-10-CM | POA: Diagnosis not present

## 2022-07-15 DIAGNOSIS — N814 Uterovaginal prolapse, unspecified: Secondary | ICD-10-CM

## 2022-07-15 DIAGNOSIS — R3129 Other microscopic hematuria: Secondary | ICD-10-CM

## 2022-07-15 LAB — POCT URINALYSIS DIPSTICK
Bilirubin, UA: NEGATIVE
Glucose, UA: NEGATIVE
Ketones, UA: NEGATIVE
Leukocytes, UA: NEGATIVE
Nitrite, UA: NEGATIVE
Protein, UA: NEGATIVE
Spec Grav, UA: <=1.005 — AB (ref 1.010–1.025)
Urobilinogen, UA: 0.2 E.U./dL
pH, UA: 5 (ref 5.0–8.0)

## 2022-07-15 NOTE — Progress Notes (Signed)
Subjective:    Patient ID: Joann Wilson, female    DOB: 1971-03-21, 51 y.o.   MRN: [id number]  HPI  Joann Wilson presents for vulvar biopsy.  She is also complaining of darker urine and a trace of blood in her underwear yesterday (Day 19 of cycle).  Her menstrual cycles have varied in length--23-29 days this past year.  She denies recent intercourse or painful bowel movements.  She still is having the labial irritation and burning.  She uses [employer].  She does not wash vigorously.  She has not use any creams in the area.  Joann Wilson also wanted me to know that in May, after pelvic exam done by PCP, and she felt heavy in pelvis.  She has not felt this since.   Review of Systems  Constitutional: Negative.   Respiratory: Negative.    Cardiovascular: Negative.   Gastrointestinal:        Can have constipation but not recently --eats bran cereal and uses a stool to elevate her legs during BMs  Genitourinary:  Positive for vaginal pain.       Spotting one day (yesterday)  Psychiatric/Behavioral: Negative.         Objective:   Physical Exam Vitals reviewed.  Constitutional:      General: She is not in acute distress.    Appearance: She is well-developed.  HENT:     Head: Normocephalic and atraumatic.  Eyes:     Conjunctiva/sclera: Conjunctivae normal.  Cardiovascular:     Rate and Rhythm: Normal rate.  Pulmonary:     Effort: Pulmonary effort is normal.  Genitourinary:      Comments: Tanner V Vulva:  redness at fourchette and now second area to the right--she said it could be from scratching.  Vagina:  Pink, no lesions, no discharge, no blood Cervix:  No CMT, no lesion Uterus:  Non tender, mobile, retroverted with mild prolapse. Skin:    General: Skin is warm and dry.  Neurological:     Mental Status: She is alert and oriented to person, place, and time.  Psychiatric:        Mood and Affect: Mood normal.    Vitals:   07/15/22 0818  BP: 109/67  Pulse: 91  Weight: 124  lb (56.2 kg)  Height: 5\' 4"  (1.626 m)       Assessment & Plan:  51 year old perimenopausal female with 2 separate complaints.  Vulvar burning and redness.  Biopsy of the fourchette today.  See details below. Urinary discomfort.  Patient did a good clean-catch urine and there is still blood in her urine.  We will send this for microscopy and culture.  VULVAR BIOPSY NOTE  The indications for vulvar biopsy (rule out neoplasia, establish lichen sclerosus/planus diagnosis etc...) were reviewed.   Risks of the biopsy including pain, bleeding, infection, inadequate specimen, and need for additional procedures  were discussed. The patient stated understanding and agreed to undergo procedure today. Consent was signed,  time out performed.  The patient's vulva was prepped with Betadine. 1% lidocaine was injected into fourchette. A 3-mm punch biopsy was done, biopsy tissue was picked up with sterile forceps and sterile scissors were used to excise the lesion.  Small bleeding was noted and hemostasis was achieved using silver nitrate sticks.  The patient tolerated the procedure well. Post-procedure instructions  (pelvic rest for one week) were given to the patient. The patient is to call with heavy bleeding, fever greater than 100.4, foul smelling vaginal discharge or  other concerns. The patient will be return to clinic in two weeks for discussion of results.

## 2022-07-16 LAB — MICROSCOPIC MESSAGE

## 2022-07-16 LAB — URINALYSIS, ROUTINE W REFLEX MICROSCOPIC
Bacteria, UA: NONE SEEN /HPF
Bilirubin Urine: NEGATIVE
Glucose, UA: NEGATIVE
Hyaline Cast: NONE SEEN /LPF
Ketones, ur: NEGATIVE
Nitrite: NEGATIVE
Protein, ur: NEGATIVE
Specific Gravity, Urine: 1.004 (ref 1.001–1.035)
WBC, UA: NONE SEEN /HPF (ref 0–5)
pH: 7 (ref 5.0–8.0)

## 2022-07-17 LAB — URINE CULTURE
MICRO NUMBER:: 13997463
SPECIMEN QUALITY:: ADEQUATE

## 2022-07-17 LAB — SURGICAL PATHOLOGY

## 2022-07-19 ENCOUNTER — Encounter: Payer: Self-pay | Admitting: Obstetrics & Gynecology

## 2022-07-19 ENCOUNTER — Telehealth: Payer: Self-pay | Admitting: Obstetrics & Gynecology

## 2022-07-19 ENCOUNTER — Other Ambulatory Visit: Payer: Self-pay | Admitting: Obstetrics & Gynecology

## 2022-07-19 MED ORDER — CLOBETASOL PROPIONATE 0.05 % EX OINT: 1.0000 | TOPICAL_OINTMENT | Freq: Two times a day (BID) | CUTANEOUS | 0 refills | Status: DC

## 2022-07-19 MED ORDER — SULFAMETHOXAZOLE-TRIMETHOPRIM 800-160 MG PO TABS: 1.0000 | ORAL_TABLET | Freq: Two times a day (BID) | ORAL | 0 refills | Status: DC

## 2022-07-19 NOTE — Telephone Encounter (Signed)
Discussed test results.  She will come back in about 4 weeks at the end of her steroid taper and will also get another clean catch urine to see if hematuria has resolved.  It does appear to be present for a few months and I would recommend a urological workup if there is no infection.

## 2022-07-22 ENCOUNTER — Encounter: Payer: Self-pay | Admitting: Obstetrics & Gynecology

## 2022-07-24 ENCOUNTER — Encounter: Payer: Self-pay | Admitting: Family Medicine

## 2022-07-24 NOTE — Telephone Encounter (Signed)
I am really not sure about this 1 we would have to call the breast center and see what the specific order is.  I can order an ultrasound but it is considered diagnostic and less there is was one that we can order that considered screening.  But I would hate to order the wrong thing and then she ends up with a bill.

## 2022-07-25 ENCOUNTER — Ambulatory Visit (INDEPENDENT_AMBULATORY_CARE_PROVIDER_SITE_OTHER): Payer: BC Managed Care – PPO

## 2022-07-25 ENCOUNTER — Ambulatory Visit (INDEPENDENT_AMBULATORY_CARE_PROVIDER_SITE_OTHER): Payer: BC Managed Care – PPO | Admitting: Family Medicine

## 2022-07-25 ENCOUNTER — Encounter: Payer: Self-pay | Admitting: Family Medicine

## 2022-07-25 VITALS — BP 118/72 | HR 88 | Temp 98.2°F

## 2022-07-25 DIAGNOSIS — R109 Unspecified abdominal pain: Secondary | ICD-10-CM

## 2022-07-25 LAB — POCT UA - GLUCOSE/PROTEIN
Glucose, UA: NEGATIVE
Protein, UA: NEGATIVE

## 2022-07-25 NOTE — Progress Notes (Signed)
Acute Office Visit  Subjective:     Patient ID: Joann Wilson, female    DOB: [birth year], 51 y.o.   MRN: [id number]  Chief Complaint  Patient presents with   Abdominal Pain    HPI Patient is in today for side pain. She [place] the fair about a week and a half ago. She rode a rock n roller ride and the next day she had pain. She had a vulvar biopsy the next day. She is having a sharp pain on the right side under the rib that radiates down to the RLQ. She was lifting pumpkins a few days ago and doing lifting and moving. Admits to constipation, but is passing gas.   Review of Systems  Constitutional:  Negative for chills and fever.  Respiratory:  Negative for cough and shortness of breath.   Cardiovascular:  Negative for chest pain.  Genitourinary:  Positive for flank pain.  Neurological:  Negative for headaches.        Objective:    BP 118/72   Pulse 88   Temp 98.2 F (36.8 C) (Oral)   LMP 06/29/2022   SpO2 100%    Physical Exam Vitals and nursing note reviewed.  Constitutional:      General: She is not in acute distress.    Appearance: Normal appearance.  HENT:     Head: Normocephalic and atraumatic.     Right Ear: External ear normal.     Left Ear: External ear normal.     Nose: Nose normal.  Eyes:     Conjunctiva/sclera: Conjunctivae normal.  Cardiovascular:     Rate and Rhythm: Normal rate and regular rhythm.  Pulmonary:     Effort: Pulmonary effort is normal.     Breath sounds: Normal breath sounds.  Abdominal:     General: Abdomen is flat. Bowel sounds are normal. There is no distension.     Palpations: Abdomen is soft.     Tenderness: There is no abdominal tenderness.  Neurological:     General: No focal deficit present.     Mental Status: She is alert and oriented to person, place, and time.  Psychiatric:        Mood and Affect: Mood normal.        Behavior: Behavior normal.        Thought Content: Thought content normal.        Judgment: Judgment  normal.     Results for orders placed or performed in visit on 07/25/22  POCT UA - Glucose/Protein  Result Value Ref Range   Glucose, UA Negative Negative   Protein, UA Negative Negative        Assessment & Plan:   Problem List Items Addressed This Visit       Other   Flank pain - Primary    - pain in flank area and under right ribcage  - would like to rule out UTI causing referred pain  - rule out kidney stone with KUB  - will order CMP to look for electrolyte abnormality and assess kidney function. Pt recently took bactrim and I want to make sure CMP is normal.  - this could also be MSK related with her recommended we try tylenol, advil, salon pas patches  - POC UA per my read shows dehydration but is negative for infection (neg lueks, neg nitrites) - KUB will also allow [place] to visualize abdomen to see if this is constipation which could be another differential.  Relevant Orders   Urinalysis, Routine w reflex microscopic   COMPLETE METABOLIC PANEL WITH GFR   POCT UA - Glucose/Protein (Completed)   DG Abd 1 View   Urine Culture    No orders of the defined types were placed in this encounter.   Return PCP as regularly scheduled.  Owens Loffler, DO

## 2022-07-25 NOTE — Patient Instructions (Signed)
Escitalopram (lexapro) for anxiety  Salon pas patches (can get at [facility]) place on area of pain for <8 hours to see if it helps   Try tylenol 1000mg  Q8hrs to see if this helps; can also try advil 400-600mg  for pain relief

## 2022-07-25 NOTE — Assessment & Plan Note (Addendum)
-   pain in flank area and under right ribcage  - would like to rule out UTI causing referred pain  - rule out kidney stone with KUB  - will order CMP to look for electrolyte abnormality and assess kidney function. Pt recently took bactrim and I want to make sure CMP is normal.  - this could also be MSK related with her recommended we try tylenol, advil, salon pas patches  - POC UA per my read shows dehydration but is negative for infection (neg lueks, neg nitrites) - KUB will also allow [place] to visualize abdomen to see if this is constipation which could be another differential.

## 2022-07-26 ENCOUNTER — Encounter: Payer: Self-pay | Admitting: Family Medicine

## 2022-07-26 LAB — COMPLETE METABOLIC PANEL WITH GFR
AG Ratio: 1.8 (calc) (ref 1.0–2.5)
ALT: 27 U/L (ref 6–29)
AST: 27 U/L (ref 10–35)
Albumin: 4.6 g/dL (ref 3.6–5.1)
Alkaline phosphatase (APISO): 69 U/L (ref 37–153)
BUN/Creatinine Ratio: 14 (calc) (ref 6–22)
BUN: 15 mg/dL (ref 7–25)
CO2: 28 mmol/L (ref 20–32)
Calcium: 9.3 mg/dL (ref 8.6–10.4)
Chloride: 103 mmol/L (ref 98–110)
Creat: 1.05 mg/dL — ABNORMAL HIGH (ref 0.50–1.03)
Globulin: 2.6 g/dL (calc) (ref 1.9–3.7)
Glucose, Bld: 103 mg/dL — ABNORMAL HIGH (ref 65–99)
Potassium: 4.6 mmol/L (ref 3.5–5.3)
Sodium: 138 mmol/L (ref 135–146)
Total Bilirubin: 0.3 mg/dL (ref 0.2–1.2)
Total Protein: 7.2 g/dL (ref 6.1–8.1)
eGFR: 64 mL/min/{1.73_m2} (ref >=60)

## 2022-07-27 LAB — URINE CULTURE
MICRO NUMBER:: 14046843
Result:: NO GROWTH
SPECIMEN QUALITY:: ADEQUATE

## 2022-08-08 ENCOUNTER — Encounter: Payer: Self-pay | Admitting: Family Medicine

## 2022-08-08 ENCOUNTER — Ambulatory Visit (INDEPENDENT_AMBULATORY_CARE_PROVIDER_SITE_OTHER): Payer: BC Managed Care – PPO | Admitting: Family Medicine

## 2022-08-08 VITALS — BP 126/73 | HR 85 | Ht 64.0 in | Wt 120.0 lb

## 2022-08-08 DIAGNOSIS — R790 Abnormal level of blood mineral: Secondary | ICD-10-CM

## 2022-08-08 DIAGNOSIS — E038 Other specified hypothyroidism: Secondary | ICD-10-CM

## 2022-08-08 DIAGNOSIS — R3129 Other microscopic hematuria: Secondary | ICD-10-CM

## 2022-08-08 DIAGNOSIS — Z79890 Hormone replacement therapy (postmenopausal): Secondary | ICD-10-CM | POA: Diagnosis not present

## 2022-08-08 DIAGNOSIS — L280 Lichen simplex chronicus: Secondary | ICD-10-CM

## 2022-08-08 DIAGNOSIS — R1011 Right upper quadrant pain: Secondary | ICD-10-CM

## 2022-08-08 NOTE — Progress Notes (Signed)
Established Patient Office Visit  Subjective   Patient ID: Joann Wilson, female    DOB: 01/30/1971  Age: 51 y.o. MRN: [id number]  Chief Complaint  Patient presents with   Hypothyroidism    HPI  Hypothyroidism - Taking medication regularly in the AM away from food and vitamins, etc. No recent change to skin, hair, or energy levels.  She would like to go ahead and get her levels checked.  She has otherwise been feeling well in this regard.  Been more stressed about her health in regards to a irritated area in the vaginal area.  Biopsy by Dr. Gala Romney, OB/GYN.  Vulvar biopsy confirmed lichenoid dermatitis.  She has been treating with topical steroid and has a follow-up Monday.   Was also noted to have blood in her urine on urinalysis.  She just noticed a little trace urine in the underwear the day before her appointment on October 2.  She was still having some labial irritation and burning they opted to do a urinalysis which did show some blood.  Urine was sent for microscopic analysis and they noted less than 3 red blood cells per high-powered field which is in the normal range.  She was treated for UTI.  Urine culture grew out 50-100,000 CFU with staph.  Which is most commonly a skin bacteria.  She has had a repeat urine culture since then which was negative.    ROS    Objective:     BP 126/73   Pulse 85   Ht 5\' 4"  (1.626 m)   Wt 120 lb (54.4 kg)   LMP 06/29/2022   SpO2 100%   BMI 20.60 kg/m    Physical Exam Vitals reviewed.  Constitutional:      Appearance: She is well-developed.  HENT:     Head: Normocephalic and atraumatic.  Eyes:     Conjunctiva/sclera: Conjunctivae normal.  Cardiovascular:     Rate and Rhythm: Normal rate.  Pulmonary:     Effort: Pulmonary effort is normal.  Skin:    General: Skin is dry.     Coloration: Skin is not pale.  Neurological:     Mental Status: She is alert and oriented to person, place, and time.  Psychiatric:        Behavior:  Behavior normal.      Results for orders placed or performed in visit on 08/08/22  TSH  Result Value Ref Range   TSH 0.90 mIU/L  Estradiol  Result Value Ref Range   Estradiol 93 pg/mL  Progesterone  Result Value Ref Range   Progesterone 0.5 ng/mL  Follicle stimulating hormone  Result Value Ref Range   FSH 36.7 mIU/mL  Luteinizing hormone  Result Value Ref Range   LH 21.1 mIU/mL  CBC  Result Value Ref Range   WBC 10.0 3.8 - 10.8 Thousand/uL   RBC 4.09 3.80 - 5.10 Million/uL   Hemoglobin 12.3 11.7 - 15.5 g/dL   HCT 38.1 35.0 - 45.0 %   MCV 93.2 80.0 - 100.0 fL   MCH 30.1 27.0 - 33.0 pg   MCHC 32.3 32.0 - 36.0 g/dL   RDW 12.4 11.0 - 15.0 %   Platelets 258 140 - 400 Thousand/uL   MPV 10.6 7.5 - 12.5 fL  VITAMIN D 25 Hydroxy (Vit-D Deficiency, Fractures)  Result Value Ref Range   Vit D, 25-Hydroxy 33 30 - 100 ng/mL  Fe+TIBC+Fer  Result Value Ref Range   Iron 121 45 - 160 mcg/dL  TIBC 337 250 - 450 mcg/dL (calc)   %SAT 36 16 - 45 % (calc)   Ferritin 27 16 - 232 ng/mL  Urine Microscopic  Result Value Ref Range   WBC, UA NONE SEEN 0 - 5 /HPF   RBC / HPF NONE SEEN 0 - 2 /HPF   Squamous Epithelial / LPF NONE SEEN < OR = 5 /HPF   Bacteria, UA NONE SEEN NONE SEEN /HPF   Hyaline Cast NONE SEEN NONE SEEN /LPF   Note        The 10-year ASCVD risk score (Arnett DK, et al., 2019) is: 1.2%* (Cholesterol units were assumed)    Assessment & Plan:   Problem List Items Addressed This Visit       Endocrine   Hypothyroidism - Primary    Due to recheck TSH.  She is taking the once a week and the 125 daily.       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  Follow-up next week.        Genitourinary   Microscopic hematuria    We will do some additional work-up.  She actually had a microscopic done a couple of weeks ago which was negative for whole red blood cells.  She is completely asymptomatic today so working to repeat the microscopic if negative then should not need any additional work-up at this point in time and then we can always recheck it again in just 6 months to make sure that she is not having intermittent microscopic hematuria.  Last culture was negative so active infection is very unlikely at this point in time.        Other   Hormone replacement therapy (HRT)    Recheck levels.       Relevant Orders   TSH (Completed)   Estradiol (Completed)   Progesterone (Completed)   Follicle stimulating hormone (Completed)   Luteinizing hormone (Completed)   CBC (Completed)   VITAMIN D 25 Hydroxy (Vit-D Deficiency, Fractures) (Completed)   Fe+TIBC+Fer (Completed)   Other Visit Diagnoses     Low ferritin       Relevant Orders   TSH (Completed)   Estradiol (Completed)   Progesterone (Completed)   Follicle stimulating hormone (Completed)   Luteinizing hormone (Completed)   CBC (Completed)   VITAMIN D 25 Hydroxy (Vit-D Deficiency, Fractures) (Completed)   Fe+TIBC+Fer (Completed)   RUQ pain       Relevant Orders   TSH (Completed)   Estradiol (Completed)   Progesterone (Completed)   Follicle stimulating hormone (Completed)   Luteinizing hormone (Completed)   CBC (Completed)   VITAMIN D 25 Hydroxy (Vit-D Deficiency, Fractures) (Completed)   Fe+TIBC+Fer (Completed)   Urinalysis, microscopic only       Right upper quadrant discomfort-it sounds like when her bowels are slowing down she is getting some discomfort in that area taking the MiraLAX for a week did help and she moved her bowels and the pain went away.  She has been off of the MiraLAX for several  days but is starting to get that discomfort again so she is restarted the MiraLAX it  is safe to stay on it daily if needed.  Only hold if having loose stools or diarrhea.    Return in about 6 weeks (around 09/19/2022) for thyroid.    [name], MD

## 2022-08-08 NOTE — Assessment & Plan Note (Signed)
Recheck levels 

## 2022-08-08 NOTE — Assessment & Plan Note (Signed)
Due to recheck TSH.  She is taking the 120mcg once a week and the 125 daily.

## 2022-08-09 ENCOUNTER — Encounter: Payer: Self-pay | Admitting: Family Medicine

## 2022-08-09 DIAGNOSIS — L280 Lichen simplex chronicus: Secondary | ICD-10-CM | POA: Insufficient documentation

## 2022-08-09 LAB — IRON,TIBC AND FERRITIN PANEL
%SAT: 36 % (calc) (ref 16–45)
Ferritin: 27 ng/mL (ref 16–232)
Iron: 121 ug/dL (ref 45–160)
TIBC: 337 mcg/dL (calc) (ref 250–450)

## 2022-08-09 LAB — CBC
HCT: 38.1 % (ref 35.0–45.0)
Hemoglobin: 12.3 g/dL (ref 11.7–15.5)
MCH: 30.1 pg (ref 27.0–33.0)
MCHC: 32.3 g/dL (ref 32.0–36.0)
MCV: 93.2 fL (ref 80.0–100.0)
MPV: 10.6 fL (ref 7.5–12.5)
Platelets: 258 10*3/uL (ref 140–400)
RBC: 4.09 10*6/uL (ref 3.80–5.10)
RDW: 12.4 % (ref 11.0–15.0)
WBC: 10 10*3/uL (ref 3.8–10.8)

## 2022-08-09 LAB — URINALYSIS, MICROSCOPIC ONLY
Bacteria, UA: NONE SEEN /HPF
Hyaline Cast: NONE SEEN /LPF
RBC / HPF: NONE SEEN /HPF (ref 0–2)
Squamous Epithelial / HPF: NONE SEEN /HPF (ref <=5)
WBC, UA: NONE SEEN /HPF (ref 0–5)

## 2022-08-09 LAB — LUTEINIZING HORMONE: LH: 21.1 m[IU]/mL

## 2022-08-09 LAB — ESTRADIOL: Estradiol: 93 pg/mL

## 2022-08-09 LAB — FOLLICLE STIMULATING HORMONE: FSH: 36.7 m[IU]/mL

## 2022-08-09 LAB — PROGESTERONE: Progesterone: 0.5 ng/mL

## 2022-08-09 LAB — VITAMIN D 25 HYDROXY (VIT D DEFICIENCY, FRACTURES): Vit D, 25-Hydroxy: 33 ng/mL (ref 30–100)

## 2022-08-09 LAB — TSH: TSH: 0.9 mIU/L

## 2022-08-09 NOTE — Assessment & Plan Note (Signed)
We will do some additional work-up.  She actually had a microscopic done a couple of weeks ago which was negative for whole red blood cells.  She is completely asymptomatic today so working to repeat the microscopic if negative then should not need any additional work-up at this point in time and then we can always recheck it again in just 6 months to make sure that she is not having intermittent microscopic hematuria.  Last culture was negative so active infection is very unlikely at this point in time.

## 2022-08-09 NOTE — Progress Notes (Signed)
HI Joann Wilson to see you yesterday.  Your thyroid looks great.  Your blood count is normal.  Vitamin D is definitely on the low end.  So would recommend taking your vitamin D supplement through the fall and winter months at least.  And may be through the summer as well.  No anemia.  Your hormone levels look good especially since you are on hormone replacement.  Your total iron looks great but your stores are still just a little bit on the lower end so just make sure you are continue to work on eating iron rich foods.  There are no whole red blood cells in your urine which is very reassuring that there is no worrisome level of hematuria going on right now.  We can always do another random spot check in 6 months just to make sure.

## 2022-08-09 NOTE — Assessment & Plan Note (Signed)
Currently diagnosed by GYN and being actively managed.  Follow-up next week.

## 2022-08-12 ENCOUNTER — Ambulatory Visit: Payer: BC Managed Care – PPO

## 2022-08-12 ENCOUNTER — Ambulatory Visit: Payer: BC Managed Care – PPO | Admitting: Obstetrics & Gynecology

## 2022-08-14 ENCOUNTER — Ambulatory Visit
Admission: RE | Admit: 2022-08-14 | Discharge: 2022-08-14 | Disposition: A | Discharge status: Home / Self Care | Payer: BC Managed Care – PPO | Source: Ambulatory Visit | Attending: Family Medicine | Admitting: Family Medicine

## 2022-08-14 DIAGNOSIS — Z1231 Encounter for screening mammogram for malignant neoplasm of breast: Secondary | ICD-10-CM

## 2022-08-15 NOTE — Progress Notes (Signed)
Please call patient. Normal mammogram.  Repeat in 1 year.  

## 2022-08-19 ENCOUNTER — Ambulatory Visit: Payer: BC Managed Care – PPO | Admitting: Obstetrics & Gynecology

## 2022-08-22 ENCOUNTER — Telehealth: Payer: Self-pay | Admitting: Family Medicine

## 2022-08-22 NOTE — Telephone Encounter (Signed)
Patient called stated she spoke with you previously about an appointment before she cancels with the other provider. All your appointments are taken for this week did you want an acute to be used I can call patient back to confirm time and day.

## 2022-08-26 ENCOUNTER — Ambulatory Visit (INDEPENDENT_AMBULATORY_CARE_PROVIDER_SITE_OTHER): Payer: BC Managed Care – PPO | Admitting: Family Medicine

## 2022-08-26 ENCOUNTER — Ambulatory Visit (INDEPENDENT_AMBULATORY_CARE_PROVIDER_SITE_OTHER): Payer: BC Managed Care – PPO

## 2022-08-26 ENCOUNTER — Encounter: Payer: Self-pay | Admitting: Family Medicine

## 2022-08-26 ENCOUNTER — Ambulatory Visit: Payer: BC Managed Care – PPO | Admitting: Obstetrics & Gynecology

## 2022-08-26 VITALS — BP 129/75 | HR 84 | Wt 121.0 lb

## 2022-08-26 DIAGNOSIS — R1011 Right upper quadrant pain: Secondary | ICD-10-CM

## 2022-08-26 DIAGNOSIS — N9089 Other specified noninflammatory disorders of vulva and perineum: Secondary | ICD-10-CM

## 2022-08-26 DIAGNOSIS — L280 Lichen simplex chronicus: Secondary | ICD-10-CM | POA: Diagnosis not present

## 2022-08-26 NOTE — Assessment & Plan Note (Signed)
Continue with daily topical steroid until I see her back in about 3 weeks.  She already has an appointment.  If it continues to look better then we can try spacing the steroid to every other day.  Avoid any irritation itching scratching etc.  Call if any concerns or new symptoms or worsening symptoms.

## 2022-08-26 NOTE — Progress Notes (Signed)
   Established Patient Office Visit  Subjective   Patient ID: Joann Wilson, female    DOB: May 18, 1971  Age: 51 y.o. MRN: [id number]  Chief Complaint  Patient presents with   Follow-up    HPI  Here to follow-up on the lichen dermatitis.  In the vaginal area that is been followed by Dr. Jearld Lesch it previously.  She has been applying the topical steroid cream for about 6 weeks total at this point.  She was due for recheck to see if the area was healing.  The diagnosis is based on biopsy report.  She was not 100% confident she was applying it exactly in the correct area but has been trying.  She also has been moving her bowels little bit more recently so has been wiping more and just wanted to make me aware.  Recently saw Dr. Tamera Punt for some right-sided rib pain and right upper quadrant pain.  She did get her bowels moving after having an x-ray that indicated some increased stool burden.  Bowels are now moving normally and very soft.  But still having some intermittent discomfort in that right lateral rib below the arm and in that right upper quadrant area.    ROS    Objective:     BP 129/75   Pulse 84   Wt 121 lb (54.9 kg)   BMI 20.77 kg/m    Physical Exam Genitourinary:      Comments: 2 fairly small lesions they have an erythematous border with a little bit more pale skin in the center.  No open wound or drainage.  They are actually very shallow no cratering.  Nontender on exam.  The one at the fourchette is just a little bit larger and more oblong.     No results found for any visits on 08/26/22.    The 10-year ASCVD risk score (Arnett DK, et al., 2019) is: 1.2%* (Cholesterol units were assumed)    Assessment & Plan:   Problem List Items Addressed This Visit       Musculoskeletal and Integument   Lichenoid dermatitis - Primary    Continue with daily topical steroid until I see her back in about 3 weeks.  She already has an appointment.  If it continues to look better  then we can try spacing the steroid to every other day.  Avoid any irritation itching scratching etc.  Call if any concerns or new symptoms or worsening symptoms.        Genitourinary   Vulvar lesion   Other Visit Diagnoses     Right upper quadrant pain       Relevant Orders   US Abdomen Complete      RUQ pain -no red flag symptoms no worrisome findings just a little bit of mild tenderness on exam.  Will schedule ultrasound for further work-up.  Is a bit of a drive for her to get here so we will see if we can get it scheduled today.  No follow-ups on file.    [name], MD

## 2022-08-27 NOTE — Progress Notes (Signed)
Hi Joann Wilson news!  No worrisome findings on the ultrasound they did not see any gallstones or thickening of the gallbladder.  No focal lesions on the liver.  So everything in that area looks great.  Kidneys also look normal and pancreas which sits more on the left is unremarkable.  Lets just continue to monitor for the next week or 2 and if its not improving we can go from there.

## 2022-08-29 ENCOUNTER — Ambulatory Visit: Payer: BC Managed Care – PPO | Admitting: Family Medicine

## 2022-09-09 ENCOUNTER — Encounter: Payer: Self-pay | Admitting: Family Medicine

## 2022-09-12 ENCOUNTER — Other Ambulatory Visit: Payer: Self-pay | Admitting: *Deleted

## 2022-09-12 MED ORDER — AMBULATORY NON FORMULARY MEDICATION: 5 refills | Status: DC

## 2022-09-15 ENCOUNTER — Other Ambulatory Visit: Payer: Self-pay | Admitting: Family Medicine

## 2022-09-15 DIAGNOSIS — R0981 Nasal congestion: Secondary | ICD-10-CM

## 2022-09-19 ENCOUNTER — Ambulatory Visit (INDEPENDENT_AMBULATORY_CARE_PROVIDER_SITE_OTHER): Payer: BC Managed Care – PPO

## 2022-09-19 ENCOUNTER — Encounter: Payer: Self-pay | Admitting: Family Medicine

## 2022-09-19 ENCOUNTER — Ambulatory Visit (INDEPENDENT_AMBULATORY_CARE_PROVIDER_SITE_OTHER): Payer: BC Managed Care – PPO | Admitting: Family Medicine

## 2022-09-19 VITALS — BP 110/69 | HR 93 | Ht 64.0 in | Wt 121.0 lb

## 2022-09-19 DIAGNOSIS — R7309 Other abnormal glucose: Secondary | ICD-10-CM

## 2022-09-19 DIAGNOSIS — Z79890 Hormone replacement therapy (postmenopausal): Secondary | ICD-10-CM

## 2022-09-19 DIAGNOSIS — R1011 Right upper quadrant pain: Secondary | ICD-10-CM

## 2022-09-19 DIAGNOSIS — F411 Generalized anxiety disorder: Secondary | ICD-10-CM

## 2022-09-19 DIAGNOSIS — L280 Lichen simplex chronicus: Secondary | ICD-10-CM

## 2022-09-19 LAB — POCT GLYCOSYLATED HEMOGLOBIN (HGB A1C): Hemoglobin A1C: 6.6 % — AB (ref 4.0–5.6)

## 2022-09-19 MED ORDER — ESCITALOPRAM OXALATE 10 MG PO TABS: ORAL_TABLET | ORAL | 1 refills | Status: DC

## 2022-09-19 NOTE — Progress Notes (Signed)
Established Patient Office Visit  Subjective   Patient ID: Joann Wilson, female    DOB: 21-Feb-1971  Age: 51 y.o. 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  She has been applying the topical steroid cream twice a day for couple months at this point in time.  She is not noticing any tenderness or irritation she has not been recently sexually active.  She is also still having that right upper quadrant pain it is in the rib area but also in the right mid abdomen.  And more recently she has been noticing that if she turns she will feel a tight pressure in her right upper shoulder and right upper anterior chest.  We initially did a plain film which showed some moderate stool burden.  She used some MiraLAX even though she goes regularly and she has a bowel movement every day.  We also ended up doing an ultrasound for further workup.  Study Result  Narrative & Impression  CLINICAL DATA:  Right upper quadrant pain   EXAM: ABDOMEN ULTRASOUND COMPLETE   COMPARISON:  None Available.   FINDINGS: Gallbladder: No gallstones or wall thickening visualized. No sonographic Murphy sign noted by sonographer.   Common bile duct: Diameter: 4 mm   Liver: No focal lesion identified. Within normal limits in parenchymal echogenicity. Portal vein is patent on color Doppler imaging with normal direction of blood flow towards the liver.   IVC: No abnormality visualized.   Pancreas: Visualized portion unremarkable.   Spleen: Size and appearance within normal limits.   Right Kidney: Length: 10.9 cm. Echogenicity within normal limits. No mass or hydronephrosis visualized.   Left Kidney: Length: 11.5 cm. Echogenicity within normal limits. No mass or hydronephrosis visualized.   Abdominal aorta: No aneurysm visualized.   Other findings: None.   IMPRESSION: Negative examination.        ROS    Objective:     BP 110/69 (BP Location: Left Arm, Patient Position: Sitting, Cuff Size: Normal)   Pulse 93   Ht 5\' 4"  (1.626 m)   Wt 121 lb 0.6 oz (54.9 kg)   SpO2 99%   BMI 20.78 kg/m    Physical Exam Vitals reviewed.  Constitutional:      Appearance: She is well-developed.  HENT:     Head: Normocephalic and atraumatic.  Eyes:     Conjunctiva/sclera: Conjunctivae normal.  Cardiovascular:     Rate and Rhythm: Normal rate.  Pulmonary:     Effort: Pulmonary effort is normal.  Genitourinary:      Comments: Just some slight erythema in these 2 areas.  No ulceration.  There is what looks like maybe a little scar tissue in the middle.  But that is not new. Skin:    General: Skin is dry.     Coloration: Skin is not pale.  Neurological:     Mental Status: She is alert and oriented to person, place, and time.  Psychiatric:        Behavior: Behavior normal.      Results for orders placed or performed in visit on 09/19/22  POCT glycosylated hemoglobin (Hb A1C)  Result Value Ref Range   Hemoglobin A1C 6.6 (A) 4.0 - 5.6 %   HbA1c POC (<> result, manual entry)     HbA1c, POC (prediabetic range)     HbA1c, POC (controlled diabetic range)  The 10-year ASCVD risk score (Arnett DK, et al., 2019) is: 0.9%* (Cholesterol units were assumed)    Assessment & Plan:   Problem List Items Addressed This Visit       Musculoskeletal and Integument   Lichenoid dermatitis    Examine the area today and I do feel like it looks better.  There is still just a little pinkness to the skin but no significant irritation. Ok to taper the steroid cream.         Other   Hormone replacement therapy (HRT)    Last period was 38 days ago and has noticed some recent hot flases.  She is on biologic HRT.       GAD (generalized anxiety disorder)    She is still considering possibly doing a trial of an SSRI.  I think she be a great candidate for Lexapro.  I did go ahead  and send it to the pharmacy but she is going to probably wait till after the holidays.  An SSRI could also help with some of the menopausal symptoms such as hot flashes so it might be helpful in that respect.  She can always start with a half of a tab and we can gradually go up from there as tolerated.      Relevant Medications   escitalopram (LEXAPRO) 10 MG tablet   Abnormal glucose - Primary   Relevant Orders   POCT glycosylated hemoglobin (Hb A1C) (Completed)   Other Visit Diagnoses     RUQ pain       Relevant Orders   Ambulatory referral to Gastroenterology   DG Abd 1 View (Completed)       Right upper quadrant pain-at this point I think it would be worthwhile referring her to GI for further evaluation.  They may decide to do some further workup on the gallbladder possibly such as a HIDA scan etc.  Also noted on recent labs that her glucose was elevated.  She was not fasting was concerned about the possibility of diabetes.  Both of her parents are diabetic.  She has never had any problems before.  Return in about 3 months (around 12/19/2022) for Medications .    [name], MD

## 2022-09-19 NOTE — Assessment & Plan Note (Signed)
Last period was 38 days ago and has noticed some recent hot flases.  She is on biologic HRT.

## 2022-09-19 NOTE — Assessment & Plan Note (Signed)
She is still considering possibly doing a trial of an SSRI.  I think she be a great candidate for Lexapro.  I did go ahead and send it to the pharmacy but she is going to probably wait till after the holidays.  An SSRI could also help with some of the menopausal symptoms such as hot flashes so it might be helpful in that respect.  She can always start with a half of a tab and we can gradually go up from there as tolerated.

## 2022-09-19 NOTE — Assessment & Plan Note (Signed)
Examine the area today and I do feel like it looks better.  There is still just a little pinkness to the skin but no significant irritation. Ok to taper the steroid cream.

## 2022-09-19 NOTE — Patient Instructions (Addendum)
Taper down on the cream to once a day for 2 weeks,  Then 3 nights per week for 2 weeks,  Then 2 nights per week for 2 weeks and then stop.

## 2022-09-23 ENCOUNTER — Encounter: Payer: Self-pay | Admitting: Family Medicine

## 2022-09-23 NOTE — Progress Notes (Signed)
Hi [name], just shows nonobstructive gas which is good so no sign of bowel obstruction.  There is still a moderate amount of stool.  I think just may be continuing with a little bit of a softener or mild laxative until stools are a little bit more loose or runny would be helpful.  But nothing that seems to be causing any obstruction which is reassuring.

## 2022-09-23 NOTE — Progress Notes (Signed)
HI Joann Wilson,  Sorry for the delay in getting back with you about the A1c results.  It is elevated at 6.6 which is technically in the diabetes range.  So would actually like to verify this with the venipuncture as this really would surprise me that your A1c is that high.  If we caught it in the prediabetes range I would understand but to be above 6.5 I am very surprised.  So if you are okay with coming to the lab sometime this week or next week I like to verify it before we make any major changes.  In the meantime you can certainly work on cutting back on any sweet or carb intake.  You do not have to go carb free you just want a portion control it.  Also avoid any sweetened beverages.  This really is a 64-month average so just changing your diet for few days will not impact the overall number but again I would like to verify it before we make any majorchanges

## 2022-09-24 ENCOUNTER — Other Ambulatory Visit: Payer: Self-pay | Admitting: *Deleted

## 2022-09-24 DIAGNOSIS — R7309 Other abnormal glucose: Secondary | ICD-10-CM

## 2022-09-25 LAB — HEMOGLOBIN A1C
Hgb A1c MFr Bld: 5.6 % of total Hgb (ref <5.7)
Mean Plasma Glucose: 114 mg/dL
eAG (mmol/L): 6.3 mmol/L

## 2022-09-25 NOTE — Progress Notes (Signed)
Hi Joann Wilson, the venipuncture shows that you do not have diabetes or prediabetes.  Your A1c is 5.6.  We will verify our machine since clearly there is an error. We  run controls weekly so normally it will flag, if something is off.  Sorry for any concern or worry this may have caused.  That is why I wanted to recheck it, I really did not believe that it was true.  You have no other signs or symptoms of having diabetes and so I really did not believe the point-of-care result.

## 2022-10-22 ENCOUNTER — Ambulatory Visit: Payer: BC Managed Care – PPO | Admitting: Physician Assistant

## 2022-11-06 ENCOUNTER — Encounter: Payer: Self-pay | Admitting: Family Medicine

## 2022-11-18 ENCOUNTER — Ambulatory Visit: Payer: BC Managed Care – PPO | Admitting: Family Medicine

## 2022-12-19 ENCOUNTER — Encounter: Payer: Self-pay | Admitting: Family Medicine

## 2022-12-19 ENCOUNTER — Ambulatory Visit (INDEPENDENT_AMBULATORY_CARE_PROVIDER_SITE_OTHER): Payer: BC Managed Care – PPO | Admitting: Family Medicine

## 2022-12-19 VITALS — BP 112/69 | HR 75 | Ht 63.0 in | Wt 124.0 lb

## 2022-12-19 DIAGNOSIS — R7309 Other abnormal glucose: Secondary | ICD-10-CM | POA: Diagnosis not present

## 2022-12-19 DIAGNOSIS — E038 Other specified hypothyroidism: Secondary | ICD-10-CM

## 2022-12-19 DIAGNOSIS — R790 Abnormal level of blood mineral: Secondary | ICD-10-CM | POA: Diagnosis not present

## 2022-12-19 DIAGNOSIS — Z0000 Encounter for general adult medical examination without abnormal findings: Secondary | ICD-10-CM | POA: Diagnosis not present

## 2022-12-19 DIAGNOSIS — N9089 Other specified noninflammatory disorders of vulva and perineum: Secondary | ICD-10-CM

## 2022-12-19 MED ORDER — ESCITALOPRAM OXALATE 10 MG PO TABS: 10.0000 mg | ORAL_TABLET | Freq: Every day | ORAL | 1 refills | Status: DC

## 2022-12-19 NOTE — Progress Notes (Addendum)
Complete physical exam  Patient: Joann Wilson   DOB: 1971-06-03   52 y.o. Female  MRN: [id number]  Subjective:    Chief Complaint  Patient presents with   Follow-up    3 month f/u    Joann Wilson is a 52 y.o. female who presents today for a complete physical exam. She reports consuming a general diet. She generally feels well. . She does have additional problems to discuss today.  Periods have been a little bit irregular she thinks she might be perimenopausal.  She did have her periods for months and then had some heavy bleeding in January and since then has just had a day or 2 of spotting here there.  Taking an iron supplement for low ferritin.  But it is a little constipating and she has noticed a change in her bowels.   Most recent fall risk assessment:    12/19/2022    9:55 AM  Fall Risk   Falls in the past year? 0  Number falls in past yr: 0  Injury with Fall? 0  Risk for fall due to : No Fall Risks  Follow up Falls evaluation completed     Most recent depression screenings:    12/19/2022    9:56 AM 09/19/2022    1:12 PM  PHQ 2/9 Scores  PHQ - 2 Score 0 0        Patient Care Team: Hali Marry, MD as PCP - General (Family Medicine)   Outpatient Medications Prior to Visit  Medication Sig   AMBULATORY NON FORMULARY MEDICATION Medication Name: progesterone 10% (100 mg/mL). Apply 1.02 mL (1 click) daily at bedtime.  Fax: [phone]   AMBULATORY NON FORMULARY MEDICATION Medication Name: Testosterone 2 mg/mL (0.2%) Cream. Apply 0.5 mL (2 clicks) ONCE daily as directed  fax:[phone]   cetirizine (ZYRTEC) 10 MG tablet Take 10 mg by mouth as needed for allergies.   cholecalciferol (VITAMIN D) 1000 units tablet Take 1,000 Units by mouth daily.   clobetasol ointment (TEMOVATE) 3.53 % Apply 1 Application topically 2 (two) times daily.   clotrimazole-betamethasone (LOTRISONE) cream Apply 1 application topically 2 (two) times daily.   fluticasone (FLONASE)  50 MCG/ACT nasal spray SHAKE WELL AND INSTILL 2 SPRAYS IN EACH NOSTRIL EVERY DAY   IRON PO Take by mouth daily.   Magnesium 250 MG TABS Take 1 tablet by mouth at bedtime.   Multiple Vitamins-Minerals (MULTIVITAMIN WITH MINERALS) tablet Take 1 tablet by mouth daily.   pantoprazole sodium (PROTONIX) 40 mg    Probiotic Product (PROBIOTIC BLEND) CAPS Take by mouth.   rizatriptan (MAXALT-MLT) 10 MG disintegrating tablet Take 1 tablet (10 mg total) by mouth as needed for migraine. May repeat in 2 hours if needed   SOOLANTRA 1 % CREA APPLY THIN LAYER TO AFFECTED AREA ONCE DAILY AS NEEDED   SYNTHROID 100 MCG tablet Take 1 tablet (100 mcg total) by mouth once a week.   SYNTHROID 125 MCG tablet TAKE 1 TABLET BY MOUTH DAILY BEFORE AND BREAKFAST 6 Days per week.   [DISCONTINUED] escitalopram (LEXAPRO) 10 MG tablet Take 0.5 tablets (5 mg total) by mouth daily for 14 days, THEN 1 tablet (10 mg total) daily for 16 days.   No facility-administered medications prior to visit.    ROS        Objective:     BP 112/69 (BP Location: Left Arm, Patient Position: Sitting, Cuff Size: Normal)   Pulse 75   Ht 5\' 3"  (1.6 m)  Wt 124 lb 0.6 oz (56.3 kg)   SpO2 100%   BMI 21.97 kg/m    Physical Exam Vitals and nursing note reviewed.  Constitutional:      Appearance: She is well-developed.  HENT:     Head: Normocephalic and atraumatic.  Cardiovascular:     Rate and Rhythm: Normal rate and regular rhythm.     Heart sounds: Normal heart sounds.  Pulmonary:     Effort: Pulmonary effort is normal.     Breath sounds: Normal breath sounds.  Skin:    General: Skin is warm and dry.  Neurological:     Mental Status: She is alert and oriented to person, place, and time.  Psychiatric:        Behavior: Behavior normal.      Results for orders placed or performed in visit on 12/19/22  Lipid Panel w/reflex Direct LDL  Result Value Ref Range   Cholesterol 157 <200 mg/dL   HDL 53 > OR = 50 mg/dL    Triglycerides 47 <150 mg/dL   LDL Cholesterol (Calc) 90 mg/dL (calc)   Total CHOL/HDL Ratio 3.0 <5.0 (calc)   Non-HDL Cholesterol (Calc) 104 <130 mg/dL (calc)  COMPLETE METABOLIC PANEL WITH GFR  Result Value Ref Range   Glucose, Bld 79 65 - 99 mg/dL   BUN 19 7 - 25 mg/dL   Creat 0.82 0.50 - 1.03 mg/dL   eGFR 85 > OR = 60 mL/min/1.30m2   BUN/Creatinine Ratio SEE NOTE: 6 - 22 (calc)   Sodium 139 135 - 146 mmol/L   Potassium 4.4 3.5 - 5.3 mmol/L   Chloride 103 98 - 110 mmol/L   CO2 29 20 - 32 mmol/L   Calcium 9.3 8.6 - 10.4 mg/dL   Total Protein 7.3 6.1 - 8.1 g/dL   Albumin 4.7 3.6 - 5.1 g/dL   Globulin 2.6 1.9 - 3.7 g/dL (calc)   AG Ratio 1.8 1.0 - 2.5 (calc)   Total Bilirubin 0.5 0.2 - 1.2 mg/dL   Alkaline phosphatase (APISO) 72 37 - 153 U/L   AST 24 10 - 35 U/L   ALT 28 6 - 29 U/L  CBC  Result Value Ref Range   WBC 8.4 3.8 - 10.8 Thousand/uL   RBC 4.24 3.80 - 5.10 Million/uL   Hemoglobin 12.6 11.7 - 15.5 g/dL   HCT 39.2 35.0 - 45.0 %   MCV 92.5 80.0 - 100.0 fL   MCH 29.7 27.0 - 33.0 pg   MCHC 32.1 32.0 - 36.0 g/dL   RDW 12.0 11.0 - 15.0 %   Platelets 249 140 - 400 Thousand/uL   MPV 11.4 7.5 - 12.5 fL  TSH  Result Value Ref Range   TSH 1.34 mIU/L  Fe+TIBC+Fer  Result Value Ref Range   Iron 103 45 - 160 mcg/dL   TIBC 398 250 - 450 mcg/dL (calc)   %SAT 26 16 - 45 % (calc)   Ferritin 11 (L) 16 - 232 ng/mL       Assessment & Plan:    Routine Health Maintenance and Physical Exam  Immunization History  Administered Date(s) Administered   Influenza Split 06/14/2012, 07/06/2021   Influenza Whole 07/13/2009, 07/05/2010   Influenza, Seasonal, Injecte, Preservative Fre 07/14/2014   Influenza,inj,Quad PF,6+ Mos 06/25/2013, 06/19/2017, 06/09/2018, 07/01/2019, 08/10/2020   Influenza-Unspecified 07/15/2015, 07/25/2016   PFIZER(Purple Top)SARS-COV-2 Vaccination 12/24/2019, 01/19/2020, 09/03/2020, 07/06/2021   Td 04/07/2009   Tdap 03/17/2019   Zoster Recombinat (Shingrix)  03/17/2020, 05/24/2020    Health Maintenance  Topic Date Due   COVID-19 Vaccine (5 - 2023-24 season) 01/04/2023 (Originally 06/14/2022)   INFLUENZA VACCINE  01/12/2023 (Originally 05/14/2022)   MAMMOGRAM  08/14/2024   PAP SMEAR-Modifier  02/27/2027   COLONOSCOPY (Pts 45-24yrs Insurance coverage will need to be confirmed)  05/13/2027   DTaP/Tdap/Td (3 - Td or Tdap) 03/16/2029   HIV Screening  Completed   Zoster Vaccines- Shingrix  Completed   Pneumococcal Vaccine 41-9 Years old  Aged Out   HPV VACCINES  Aged Out   Hepatitis C Screening  Discontinued    Discussed health benefits of physical activity, and encouraged her to engage in regular exercise appropriate for her age and condition.  Problem List Items Addressed This Visit       Endocrine   Hypothyroidism   Relevant Orders   Lipid Panel w/reflex Direct LDL (Completed)   COMPLETE METABOLIC PANEL WITH GFR (Completed)   CBC (Completed)   TSH (Completed)     Genitourinary   Vulvar lesion   Relevant Orders   Ambulatory referral to Obstetrics / Gynecology     Other   Abnormal glucose   Other Visit Diagnoses     Wellness examination    -  Primary   Relevant Orders   Lipid Panel w/reflex Direct LDL (Completed)   COMPLETE METABOLIC PANEL WITH GFR (Completed)   CBC (Completed)   TSH (Completed)   Fe+TIBC+Fer (Completed)   Low ferritin       Relevant Orders   Fe+TIBC+Fer (Completed)       Keep up a regular exercise program and make sure you are eating a healthy diet Try to eat 4 servings of dairy a day, or if you are lactose intolerant take a calcium with vitamin D daily.  Your vaccines are up to date.   Vulvar lesion-no change.  It does not look worse or more aggravated.  We did discuss GYN referral this spring for second opinion.  She occasionally gets a little itching or irritation but not often.  Referral placed to [facility].  If repeat iron levels look great then okay to discontinue the iron.  Return in  about 3 months (around 03/21/2023).     Beatrice Lecher, MD

## 2022-12-20 LAB — COMPLETE METABOLIC PANEL WITH GFR
AG Ratio: 1.8 (calc) (ref 1.0–2.5)
ALT: 28 U/L (ref 6–29)
AST: 24 U/L (ref 10–35)
Albumin: 4.7 g/dL (ref 3.6–5.1)
Alkaline phosphatase (APISO): 72 U/L (ref 37–153)
BUN: 19 mg/dL (ref 7–25)
CO2: 29 mmol/L (ref 20–32)
Calcium: 9.3 mg/dL (ref 8.6–10.4)
Chloride: 103 mmol/L (ref 98–110)
Creat: 0.82 mg/dL (ref 0.50–1.03)
Globulin: 2.6 g/dL (calc) (ref 1.9–3.7)
Glucose, Bld: 79 mg/dL (ref 65–99)
Potassium: 4.4 mmol/L (ref 3.5–5.3)
Sodium: 139 mmol/L (ref 135–146)
Total Bilirubin: 0.5 mg/dL (ref 0.2–1.2)
Total Protein: 7.3 g/dL (ref 6.1–8.1)
eGFR: 85 mL/min/{1.73_m2} (ref >=60)

## 2022-12-20 LAB — CBC
HCT: 39.2 % (ref 35.0–45.0)
Hemoglobin: 12.6 g/dL (ref 11.7–15.5)
MCH: 29.7 pg (ref 27.0–33.0)
MCHC: 32.1 g/dL (ref 32.0–36.0)
MCV: 92.5 fL (ref 80.0–100.0)
MPV: 11.4 fL (ref 7.5–12.5)
Platelets: 249 10*3/uL (ref 140–400)
RBC: 4.24 10*6/uL (ref 3.80–5.10)
RDW: 12 % (ref 11.0–15.0)
WBC: 8.4 10*3/uL (ref 3.8–10.8)

## 2022-12-20 LAB — LIPID PANEL W/REFLEX DIRECT LDL
Cholesterol: 157 mg/dL (ref <200)
HDL: 53 mg/dL (ref >=50)
LDL Cholesterol (Calc): 90 mg/dL (calc)
Non-HDL Cholesterol (Calc): 104 mg/dL (calc) (ref <130)
Total CHOL/HDL Ratio: 3 (calc) (ref <5.0)
Triglycerides: 47 mg/dL (ref <150)

## 2022-12-20 LAB — IRON,TIBC AND FERRITIN PANEL
%SAT: 26 % (calc) (ref 16–45)
Ferritin: 11 ng/mL — ABNORMAL LOW (ref 16–232)
Iron: 103 ug/dL (ref 45–160)
TIBC: 398 mcg/dL (calc) (ref 250–450)

## 2022-12-20 LAB — TSH: TSH: 1.34 mIU/L

## 2022-12-20 NOTE — Progress Notes (Signed)
HI Kimala, your iron stores are low again and they looks better 4 months ago.  I think you said you are still taking your iron but I suspect with that heavy bleeding in January it may have put a dent in the strides with pain to get your iron up.  So I would continue with the iron for now and try to eat iron rich foods and then we can recheck again in 4 months.  All other labs look fantastic!

## 2022-12-21 ENCOUNTER — Encounter: Payer: Self-pay | Admitting: Family Medicine

## 2022-12-26 ENCOUNTER — Encounter: Payer: Self-pay | Admitting: Family Medicine

## 2022-12-27 ENCOUNTER — Telehealth: Payer: Self-pay

## 2022-12-27 NOTE — Telephone Encounter (Signed)
Initiated Prior authorization [id number] Oxalate 10MG  tablets Via: Covermymeds Case/Key: [id number] Status: Pending as of 12/27/22 Reason: Notified Pt via: Mychart

## 2023-01-10 ENCOUNTER — Other Ambulatory Visit: Payer: Self-pay | Admitting: Family Medicine

## 2023-01-13 ENCOUNTER — Encounter: Payer: Self-pay | Admitting: Family Medicine

## 2023-01-13 MED ORDER — SYNTHROID 100 MCG PO TABS: 100.0000 ug | ORAL_TABLET | ORAL | 3 refills | Status: DC

## 2023-01-17 ENCOUNTER — Encounter: Payer: Self-pay | Admitting: Family Medicine

## 2023-01-31 ENCOUNTER — Other Ambulatory Visit: Payer: Self-pay | Admitting: *Deleted

## 2023-01-31 MED ORDER — SYNTHROID 125 MCG PO TABS: ORAL_TABLET | ORAL | 1 refills | Status: DC

## 2023-04-03 ENCOUNTER — Other Ambulatory Visit: Payer: Self-pay | Admitting: *Deleted

## 2023-04-03 MED ORDER — AMBULATORY NON FORMULARY MEDICATION: 6 refills | Status: DC

## 2023-04-07 ENCOUNTER — Encounter: Payer: Self-pay | Admitting: Family Medicine

## 2023-04-07 ENCOUNTER — Ambulatory Visit (INDEPENDENT_AMBULATORY_CARE_PROVIDER_SITE_OTHER): Payer: BC Managed Care – PPO | Admitting: Family Medicine

## 2023-04-07 VITALS — BP 99/62 | HR 65 | Ht 63.0 in | Wt 124.0 lb

## 2023-04-07 DIAGNOSIS — Z8249 Family history of ischemic heart disease and other diseases of the circulatory system: Secondary | ICD-10-CM

## 2023-04-07 DIAGNOSIS — Z79890 Hormone replacement therapy (postmenopausal): Secondary | ICD-10-CM | POA: Diagnosis not present

## 2023-04-07 DIAGNOSIS — N9089 Other specified noninflammatory disorders of vulva and perineum: Secondary | ICD-10-CM | POA: Diagnosis not present

## 2023-04-07 DIAGNOSIS — R790 Abnormal level of blood mineral: Secondary | ICD-10-CM

## 2023-04-07 DIAGNOSIS — E611 Iron deficiency: Secondary | ICD-10-CM

## 2023-04-07 MED ORDER — AMBULATORY NON FORMULARY MEDICATION: 5 refills | Status: DC

## 2023-04-07 NOTE — Assessment & Plan Note (Signed)
To recheck levels today.

## 2023-04-07 NOTE — Assessment & Plan Note (Signed)
Going with OB/GYN now.

## 2023-04-07 NOTE — Assessment & Plan Note (Signed)
Printed prescription as she will be due next month.

## 2023-04-07 NOTE — Assessment & Plan Note (Signed)
Will check on price of Cardiac CT

## 2023-04-07 NOTE — Progress Notes (Signed)
   Established Patient Office Visit  Subjective   Patient ID: Joann Wilson, female    DOB: [birth year]  Age: 52 y.o. MRN: [id number]  Chief Complaint  Patient presents with   Follow-up         HPI Today for follow-up.  She was able to get a second opinion with OB/GYN and they are following the vulvar area.  They are not concerned that it is worrisome but we will follow it.  She is scheduled to get her next Pap smear with them.  She is up-to-date and her last Pap smear was May 2023.  Iron deficiency-she has been taking her iron supplement and trying to eat an iron rich diet.  She has not had any more heavy bleeding with her menstrual cycle since January and just normal.  Sometimes just some spotting.  Taking her vitamin D supplement.  We did discuss further assessing for further cardiac with risk with cardiac CT/calcium scoring.  She has a strong family history of heart disease in her father and paternal grandmother both having heart disease in their 42s.  She herself is low risk and has no significant risk factors.  Recent LDL screening through work was a little elevated than her baseline.    ROS    Objective:     BP 99/62   Pulse 65   Ht 5\' 3"  (1.6 m)   Wt 124 lb (56.2 kg)   SpO2 100%   BMI 21.97 kg/m    Physical Exam Vitals and nursing note reviewed.  Constitutional:      Appearance: She is well-developed.  HENT:     Head: Normocephalic and atraumatic.  Cardiovascular:     Rate and Rhythm: Normal rate and regular rhythm.     Heart sounds: Normal heart sounds.  Pulmonary:     Effort: Pulmonary effort is normal.     Breath sounds: Normal breath sounds.  Skin:    General: Skin is warm and dry.  Neurological:     Mental Status: She is alert and oriented to person, place, and time.  Psychiatric:        Behavior: Behavior normal.      No results found for any visits on 04/07/23.    The 10-year ASCVD risk score (Arnett DK, et al., 2019) is: 0.8%     Assessment & Plan:   Problem List Items Addressed This Visit       Genitourinary   Vulvar lesion    Going with OB/GYN now.        Other   Low iron    To recheck levels today.      Hormone replacement therapy (HRT)    Printed prescription as she will be due next month.      Family history of heart disease    Will check on price of Cardiac CT      Other Visit Diagnoses     Low ferritin    -  Primary   Relevant Orders   Fe+TIBC+Fer      I spent 20 minutes on the day of the encounter to include pre-visit record review, face-to-face time with the patient and post visit ordering of test.   Return in about 3 months (around 07/01/2023) for Medications .    [name], MD

## 2023-04-08 LAB — IRON,TIBC AND FERRITIN PANEL
%SAT: 30 % (calc) (ref 16–45)
Ferritin: 20 ng/mL (ref 16–232)
Iron: 87 ug/dL (ref 45–160)
TIBC: 289 mcg/dL (calc) (ref 250–450)

## 2023-04-08 NOTE — Progress Notes (Signed)
Your ferritin is looking better, it was 11.  It is up to 20 which is great we.  Just need to get it closer to 40 to be optimized.  Continue with the extra iron supplementation.  Have a great week!

## 2023-05-06 ENCOUNTER — Other Ambulatory Visit: Payer: Self-pay | Admitting: Family Medicine

## 2023-05-06 DIAGNOSIS — Z1231 Encounter for screening mammogram for malignant neoplasm of breast: Secondary | ICD-10-CM

## 2023-06-15 ENCOUNTER — Encounter: Payer: Self-pay | Admitting: Family Medicine

## 2023-06-17 MED ORDER — RIZATRIPTAN BENZOATE 10 MG PO TBDP: 10.0000 mg | ORAL_TABLET | ORAL | 11 refills | Status: AC | PRN

## 2023-06-17 NOTE — Telephone Encounter (Signed)
Requesting  RX rf of rizatriptan Last written 08/24/2020 Last OV 04/07/2023 Upcoming appt 07/01/2023

## 2023-07-01 ENCOUNTER — Encounter: Payer: Self-pay | Admitting: Family Medicine

## 2023-07-01 ENCOUNTER — Ambulatory Visit (INDEPENDENT_AMBULATORY_CARE_PROVIDER_SITE_OTHER): Payer: BC Managed Care – PPO | Admitting: Family Medicine

## 2023-07-01 VITALS — BP 100/61 | HR 69 | Ht 63.0 in | Wt 124.0 lb

## 2023-07-01 DIAGNOSIS — Z8249 Family history of ischemic heart disease and other diseases of the circulatory system: Secondary | ICD-10-CM | POA: Diagnosis not present

## 2023-07-01 DIAGNOSIS — E038 Other specified hypothyroidism: Secondary | ICD-10-CM

## 2023-07-01 DIAGNOSIS — E611 Iron deficiency: Secondary | ICD-10-CM

## 2023-07-01 DIAGNOSIS — F411 Generalized anxiety disorder: Secondary | ICD-10-CM

## 2023-07-01 MED ORDER — ESCITALOPRAM OXALATE 10 MG PO TABS: 10.0000 mg | ORAL_TABLET | Freq: Every day | ORAL | 1 refills | Status: DC

## 2023-07-01 NOTE — Assessment & Plan Note (Signed)
Check TSH today since it has been 6 months.

## 2023-07-01 NOTE — Assessment & Plan Note (Signed)
Lan to recheck ferritin this fall.  Has been trying to eat a more iron rich diet.

## 2023-07-01 NOTE — Assessment & Plan Note (Signed)
Will  order your cardiac CT.

## 2023-07-01 NOTE — Assessment & Plan Note (Signed)
Great with Lexapro.  Otherwise follow-up in 6 months.

## 2023-07-01 NOTE — Progress Notes (Signed)
Established Patient Office Visit  Subjective   Patient ID: Joann Wilson, female    DOB: [birth year]  Age: 52 y.o. MRN: [id number]  Chief Complaint  Patient presents with   Medical Management of Chronic Issues    HPI  F/U GAD -overall feels like she is doing really well with the Lexapro no specific concerns no specific side effects.  She feels like it has been really helpful.  GAD-7 score of 0 today.  Her periods have still been very irregular she went from June until August without a period and then bled for a couple of weeks.  She has noticed a few more hot flashes and feeling warm especially during her period and weight has been fluctuating more.  If she is on biochemical hormones.  Hypothyroidism - Taking medication regularly in the AM away from food and vitamins, etc. No recent change to skin, hair, or energy levels.  Thing that she has noticed is a little bit of thinning in her lateral eyebrows.       ROS    Objective:     BP 100/61   Pulse 69   Ht 5\' 3"  (1.6 m)   Wt 124 lb (56.2 kg)   SpO2 100%   BMI 21.97 kg/m    Physical Exam Vitals and nursing note reviewed.  Constitutional:      Appearance: Normal appearance.  HENT:     Head: Normocephalic and atraumatic.  Eyes:     Conjunctiva/sclera: Conjunctivae normal.  Cardiovascular:     Rate and Rhythm: Normal rate and regular rhythm.  Pulmonary:     Effort: Pulmonary effort is normal.     Breath sounds: Normal breath sounds.  Skin:    General: Skin is warm and dry.  Neurological:     Mental Status: She is alert.  Psychiatric:        Mood and Affect: Mood normal.      No results found for any visits on 07/01/23.    The 10-year ASCVD risk score (Arnett DK, et al., 2019) is: 0.8%    Assessment & Plan:   Problem List Items Addressed This Visit       Endocrine   Hypothyroidism - Primary    Check TSH today since it has been 6 months.      Relevant Orders   TSH     Other   Low iron    Lan  to recheck ferritin this fall.  Has been trying to eat a more iron rich diet.      GAD (generalized anxiety disorder)    Great with Lexapro.  Otherwise follow-up in 6 months.      Relevant Medications   escitalopram (LEXAPRO) 10 MG tablet   Family history of heart disease    Will  order your cardiac CT.      Relevant Orders   CT CARDIAC SCORING (SELF PAY ONLY)    Return in about 6 months (around 12/29/2023) for Wellness Exam.    [name], MD

## 2023-07-02 NOTE — Progress Notes (Signed)
Thyroid looks great!!!  It was good to see you yesterday.

## 2023-07-22 ENCOUNTER — Other Ambulatory Visit: Payer: BC Managed Care – PPO

## 2023-08-18 ENCOUNTER — Ambulatory Visit
Admission: RE | Admit: 2023-08-18 | Discharge: 2023-08-18 | Disposition: A | Discharge status: Home / Self Care | Payer: BC Managed Care – PPO | Source: Ambulatory Visit | Attending: Family Medicine | Admitting: Family Medicine

## 2023-08-18 DIAGNOSIS — Z1231 Encounter for screening mammogram for malignant neoplasm of breast: Secondary | ICD-10-CM

## 2023-08-20 NOTE — Progress Notes (Signed)
Please call patient. Normal mammogram.  Repeat in 1 year.  

## 2023-08-26 ENCOUNTER — Ambulatory Visit (INDEPENDENT_AMBULATORY_CARE_PROVIDER_SITE_OTHER): Payer: Self-pay

## 2023-08-26 DIAGNOSIS — Z8249 Family history of ischemic heart disease and other diseases of the circulatory system: Secondary | ICD-10-CM

## 2023-08-26 NOTE — Progress Notes (Signed)
HI Kemyia,  The calcium CT score was 0 which is fantastic this means you are very low risk for cardiovascular disease and plaque buildup.  It is reasonable to reassess in 5 to 10 years as long as risk factors do not change.  They will do an additional over read on the areas surrounding the heart and they should send [place] that report in the next 2 weeks.

## 2023-09-02 ENCOUNTER — Other Ambulatory Visit: Payer: Self-pay | Admitting: Family Medicine

## 2023-09-03 ENCOUNTER — Encounter: Payer: Self-pay | Admitting: Family Medicine

## 2023-09-15 NOTE — Progress Notes (Signed)
Hi Joann Wilson, over read on the surrounding tissues on the cardiac CT is normal.

## 2023-11-18 ENCOUNTER — Encounter: Payer: Self-pay | Admitting: Family Medicine

## 2023-11-18 MED ORDER — AMBULATORY NON FORMULARY MEDICATION: 0 refills | Status: DC

## 2023-11-18 NOTE — Telephone Encounter (Signed)
Hi tonya, can you check your CC'd messages.. the fax was probably routed to your inbox.   [Please print and place in my basket  Thank you

## 2023-11-21 ENCOUNTER — Other Ambulatory Visit: Payer: Self-pay | Admitting: *Deleted

## 2023-11-21 MED ORDER — AMBULATORY NON FORMULARY MEDICATION: 0 refills | Status: DC

## 2023-11-21 NOTE — Telephone Encounter (Signed)
 This has been faxed.

## 2023-11-27 ENCOUNTER — Ambulatory Visit: Payer: Self-pay | Admitting: Family Medicine

## 2023-11-27 NOTE — Telephone Encounter (Signed)
  Chief Complaint: cough Symptoms: runny nose, post nasal drip, cough, slight headache, hoarse voice, tightness in chest after coughing Frequency: x3 weeks, improved over the last week and then worsened over past 1-2 days Pertinent Negatives: Patient denies trouble breathing, earaches. Disposition: [] ED /[] Urgent Care (no appt availability in office) / [x] Appointment(In office/virtual)/ []  [facility] [place] [place] [facility]/ [] Home Care/ [] Refused Recommended Disposition /[] [place] [facility]/ []  Follow-up with PCP Additional Notes: Patient tested positive at home with Flu A on 11/07/23. Patient states it took 7-10 days for symptoms to improve. She states she has family at home sick with Flu B. She is concerned as her symptoms have returned over the past 1-2 days but she states they seem mild compared to her Flu A 3 weeks ago. She states she will take another home flu test later. She states Patient states she has taken Muccinex, Tylenol, Ibuprofen and Claritin OTC to help with symptoms. She states she would like to come in and have her PCP listen to her lungs, she is concerned for bronchitis or pneumonia. Advised patient to wear a mask for office visit. Copied from CRM [id number]. Topic: Clinical - Red Word Triage >> Nov 27, 2023 10:00 AM Suzette B wrote: [facility] that prompted transfer to Nurse Triage: Chest has been hurting, blood in mucus when blowing nose, Reason for Disposition  Cough present > 3 weeks  Answer Assessment - Initial Assessment Questions 1. DIAGNOSIS CONFIRMATION: "When was the influenza diagnosed?" "By whom?" "Did you get a test for it?"     Tested positive January 24th, home flu test.  2. MEDICINES: "Were you prescribed any medications for the influenza?"  (e.g., zanamivir [Relenza], oseltamavir [Tamiflu]).      Denies.  3. ONSET of SYMPTOMS: "When did your symptoms start?"     January 21st. She states she felt sick for about 7-10 days and states she was improving until the last 1-2  days she states she has felt sick again.  4. SYMPTOMS: "What symptoms are you most concerned about?" (e.g., runny nose, stuffy nose, sore throat, cough, breathing difficulty, fever)     Runny nose with specks of blood sometimes, productive cough, headaches, hoarse voice.   5. COUGH: "How bad is the cough?"     Not as bad today as it was last night, states she had to keep coughing. She states she has been treating with honey as well and it helps. She propped herself up on more pillows which helped the cough while sleeping.  6. FEVER: "Do you have a fever?" If Yes, ask: "What is your temperature, how was it measured, and when did it start?"     101.5 was the highest fever at the beginning of symptoms about 2 weeks ago. Denies any fevers now.  7. RESPIRATORY DISTRESS: "Are you having any trouble breathing?" If Yes, ask: "Describe your breathing."      Denies any trouble breathing.  8. FLU VACCINE: "Did you receive a flu shot this year?" (e.g., seasonal influenza, H1N1)     Yes.  9. HIGH RISK for COMPLICATIONS: "Do you have any heart or lung problems? Do you have a weakened immune system?" (e.g., CHF, COPD, asthma, HIV positive, chemotherapy, renal failure, diabetes mellitus, sickle cell anemia)       Denies.  Protocols used: Influenza (Flu) Follow-up Call-A-AH

## 2023-11-28 ENCOUNTER — Ambulatory Visit (INDEPENDENT_AMBULATORY_CARE_PROVIDER_SITE_OTHER): Payer: BLUE CROSS/BLUE SHIELD | Admitting: Family Medicine

## 2023-11-28 ENCOUNTER — Encounter: Payer: Self-pay | Admitting: Family Medicine

## 2023-11-28 VITALS — BP 104/61 | HR 73 | Ht 63.0 in | Wt 124.0 lb

## 2023-11-28 DIAGNOSIS — J019 Acute sinusitis, unspecified: Secondary | ICD-10-CM | POA: Diagnosis not present

## 2023-11-28 MED ORDER — AZITHROMYCIN 250 MG PO TABS: ORAL_TABLET | ORAL | 0 refills | Status: AC

## 2023-11-28 NOTE — Progress Notes (Signed)
   Acute Office Visit  Subjective:     Patient ID: Joann Wilson, female    DOB: 19-Oct-1970, 53 y.o.   MRN: [id number]  Chief Complaint  Patient presents with   Cough    HPI Patient is in today for not feeling well.  Unfortunately she got COVID at the beginning of January, and then about 3 weeks later she actually got flu they after traveling.  She started to feel better but now she has had increased postnasal drip drainage and voice hoarseness.  She has not run another fever.  She says that she had a family member that tested positive for flu and another 1 that tested positive for flu B.  But she did do another flu and COVID test at home and they were both negative.  She has had a sore throat.  She says that she has a tickle in her throat and has a persistent dry cough.  ROS      Objective:    BP 104/61   Pulse 73   SpO2 100%    Physical Exam Vitals and nursing note reviewed.  Constitutional:      Appearance: Normal appearance.  HENT:     Head: Normocephalic and atraumatic.     Right Ear: Tympanic membrane, ear canal and external ear normal. There is no impacted cerumen.     Left Ear: Tympanic membrane, ear canal and external ear normal. There is no impacted cerumen.     Nose: Nose normal.     Mouth/Throat:     Pharynx: Oropharynx is clear. Posterior oropharyngeal erythema present.  Eyes:     Conjunctiva/sclera: Conjunctivae normal.  Cardiovascular:     Rate and Rhythm: Normal rate and regular rhythm.  Pulmonary:     Effort: Pulmonary effort is normal.     Breath sounds: Normal breath sounds.  Musculoskeletal:     Cervical back: Neck supple. No tenderness.  Lymphadenopathy:     Cervical: No cervical adenopathy.  Skin:    General: Skin is warm and dry.  Neurological:     Mental Status: She is alert and oriented to person, place, and time.  Psychiatric:        Mood and Affect: Mood normal.     No results found for any visits on 11/28/23.      Assessment &  Plan:   Problem List Items Addressed This Visit   None Visit Diagnoses       Acute non-recurrent sinusitis, unspecified location    -  Primary   Relevant Medications   azithromycin (ZITHROMAX) 250 MG tablet        Will go ahead and treat with azithromycin.  If not better after the the weekend please let us know.  It could be that she has another viral illness and that certainly possible.  We did discuss possibly watching for a couple of days and then starting abx if not better.     Meds ordered this encounter  Medications   azithromycin (ZITHROMAX) 250 MG tablet    Sig: 2 Ttabs PO on Day 1, then one a day x 4 days.    Dispense:  6 tablet    Refill:  0    No follow-ups on file.  [name], MD

## 2023-12-15 ENCOUNTER — Other Ambulatory Visit: Payer: Self-pay | Admitting: Family Medicine

## 2023-12-17 ENCOUNTER — Encounter: Payer: Self-pay | Admitting: Family Medicine

## 2023-12-19 ENCOUNTER — Other Ambulatory Visit: Payer: Self-pay | Admitting: Family Medicine

## 2023-12-19 DIAGNOSIS — F411 Generalized anxiety disorder: Secondary | ICD-10-CM

## 2023-12-22 ENCOUNTER — Encounter: Payer: BC Managed Care – PPO | Admitting: Family Medicine

## 2024-01-05 ENCOUNTER — Ambulatory Visit (INDEPENDENT_AMBULATORY_CARE_PROVIDER_SITE_OTHER): Payer: BC Managed Care – PPO | Admitting: Family Medicine

## 2024-01-05 ENCOUNTER — Encounter: Payer: Self-pay | Admitting: Family Medicine

## 2024-01-05 VITALS — BP 110/64 | HR 61 | Ht 63.0 in | Wt 130.0 lb

## 2024-01-05 DIAGNOSIS — E611 Iron deficiency: Secondary | ICD-10-CM

## 2024-01-05 DIAGNOSIS — Z0000 Encounter for general adult medical examination without abnormal findings: Secondary | ICD-10-CM

## 2024-01-05 DIAGNOSIS — E038 Other specified hypothyroidism: Secondary | ICD-10-CM

## 2024-01-05 DIAGNOSIS — F411 Generalized anxiety disorder: Secondary | ICD-10-CM

## 2024-01-05 DIAGNOSIS — Z79890 Hormone replacement therapy (postmenopausal): Secondary | ICD-10-CM

## 2024-01-05 DIAGNOSIS — R790 Abnormal level of blood mineral: Secondary | ICD-10-CM

## 2024-01-05 NOTE — Assessment & Plan Note (Addendum)
 Continue current regimen, gets her prescriptions through [facility].

## 2024-01-05 NOTE — Assessment & Plan Note (Signed)
 Continue current dose of Lexapro that we could always go up by 5 mg if needed although can always reach out via MyChart otherwise follow-up in 6 months.

## 2024-01-05 NOTE — Progress Notes (Signed)
 Complete physical exam  Patient: Joann Wilson   DOB: [birth year]   53 y.o. Female  MRN: [id number]  Subjective:    Chief Complaint  Patient presents with   Annual Exam    Joann Wilson is a 53 y.o. female who presents today for a complete physical exam. She reports consuming a general diet.  Does exercise and walk and doing some resistance training.    She generally feels well. She reports sleeping well. She does not have additional problems to discuss today.   She is still on HRT.  Her last regular period was in January though she did have a little 1 day of a little bit of spotting since then.  She is keeping track of it.  She has noticed she feels a little bit more hot at night but not drenched in sweat.  She feels the Lexapro has been doing really well for her she says she has some days that feel little bit more rough and wonders if she would benefit from going up.  But is not quite ready to do so.   Most recent fall risk assessment:    07/01/2023   10:53 AM  Fall Risk   Falls in the past year? 0  Number falls in past yr: 0  Injury with Fall? 0  Risk for fall due to : No Fall Risks  Follow up Falls evaluation completed     Most recent depression screenings:    07/01/2023   10:52 AM 12/19/2022    9:56 AM  PHQ 2/9 Scores  PHQ - 2 Score 0 0  PHQ- 9 Score 0          Patient Care Team: Agapito Games, MD as PCP - General (Family Medicine)   Outpatient Medications Prior to Visit  Medication Sig   AMBULATORY NON FORMULARY MEDICATION Medication Name: progesterone 10% (100 mg/mL). Apply 0.25 mL (1 click) daily at bedtime.  Fax: [phone]   AMBULATORY NON FORMULARY MEDICATION Medication Name: Testosterone 2 mg/mL (0.2%) Cream. Apply 0.5 mL (2 clicks) ONCE daily as directed  [phone]   cetirizine (ZYRTEC) 10 MG tablet Take 10 mg by mouth as needed for allergies.   cholecalciferol (VITAMIN D) 1000 units tablet Take 1,000 Units by mouth daily.    escitalopram (LEXAPRO) 10 MG tablet TAKE 1 TABLET(10 MG) BY MOUTH DAILY   estradiol (ESTRACE) 0.1 MG/GM vaginal cream 1 Applicatorful once a week.   fluticasone (FLONASE) 50 MCG/ACT nasal spray SHAKE WELL AND INSTILL 2 SPRAYS IN EACH NOSTRIL EVERY DAY   IRON PO Take by mouth daily.   Magnesium 250 MG TABS Take 1 tablet by mouth at bedtime.   Multiple Vitamins-Minerals (MULTIVITAMIN WITH MINERALS) tablet Take 1 tablet by mouth daily.   rizatriptan (MAXALT-MLT) 10 MG disintegrating tablet Take 1 tablet (10 mg total) by mouth as needed for migraine. May repeat in 2 hours if needed   SOOLANTRA 1 % CREA APPLY THIN LAYER TO AFFECTED AREA ONCE DAILY AS NEEDED   SYNTHROID 100 MCG tablet TAKE 1 TABLET(100 MCG) BY MOUTH 1 TIME A WEEK   SYNTHROID 125 MCG tablet TAKE 1 TABLET BY MOUTH DAILY BEFORE BREAKFAST 6 DAYS A WEEK   [DISCONTINUED] Probiotic Product (PROBIOTIC BLEND) CAPS Take by mouth.   No facility-administered medications prior to visit.    ROS        Objective:     BP 110/64   Pulse 61   Ht 5\' 3"  (1.6 m)   Wt  130 lb (59 kg)   SpO2 100%   BMI 23.03 kg/m     Physical Exam Vitals and nursing note reviewed.  Constitutional:      Appearance: Normal appearance.  HENT:     Head: Normocephalic and atraumatic.     Right Ear: Tympanic membrane, ear canal and external ear normal.     Left Ear: Tympanic membrane, ear canal and external ear normal.     Nose: Nose normal.     Mouth/Throat:     Pharynx: Oropharynx is clear.  Eyes:     Extraocular Movements: Extraocular movements intact.     Conjunctiva/sclera: Conjunctivae normal.     Pupils: Pupils are equal, round, and reactive to light.  Neck:     Thyroid: No thyromegaly.  Cardiovascular:     Rate and Rhythm: Normal rate and regular rhythm.  Pulmonary:     Effort: Pulmonary effort is normal.     Breath sounds: Normal breath sounds.  Abdominal:     General: Bowel sounds are normal.     Palpations: Abdomen is soft.      Tenderness: There is no abdominal tenderness.  Musculoskeletal:        General: No swelling.     Cervical back: Neck supple.  Skin:    General: Skin is warm and dry.  Neurological:     Mental Status: She is alert and oriented to person, place, and time.  Psychiatric:        Mood and Affect: Mood normal.        Behavior: Behavior normal.      No results found for any visits on 01/05/24.      Assessment & Plan:    Routine Health Maintenance and Physical Exam  Immunization History  Administered Date(s) Administered   Influenza Split 06/14/2012, 07/06/2021   Influenza Whole 07/13/2009, 07/05/2010   Influenza, Seasonal, Injecte, Preservative Fre 07/14/2014   Influenza,inj,Quad PF,6+ Mos 06/25/2013, 06/19/2017, 06/09/2018, 07/01/2019, 08/10/2020   Influenza-Unspecified 07/15/2015, 07/25/2016, 06/23/2023   PFIZER(Purple Top)SARS-COV-2 Vaccination 12/24/2019, 01/19/2020, 09/03/2020, 07/06/2021   Pfizer Covid-19 Vaccine Bivalent Booster 5y-11y 06/23/2023   Td 04/07/2009   Tdap 03/17/2019   Zoster Recombinant(Shingrix) 03/17/2020, 05/24/2020    Health Maintenance  Topic Date Due   COVID-19 Vaccine (6 - 2024-25 season) 08/18/2023   MAMMOGRAM  08/17/2025   Cervical Cancer Screening (HPV/Pap Cotest)  02/27/2027   Colonoscopy  05/13/2027   DTaP/Tdap/Td (3 - Td or Tdap) 03/16/2029   INFLUENZA VACCINE  Completed   HIV Screening  Completed   Zoster Vaccines- Shingrix  Completed   HPV VACCINES  Aged Out   Hepatitis C Screening  Discontinued    Discussed health benefits of physical activity, and encouraged her to engage in regular exercise appropriate for her age and condition.  Problem List Items Addressed This Visit       Endocrine   Hypothyroidism   Relevant Orders   Fe+TIBC+Fer   TSH   CMP14+EGFR   Lipid panel   CBC with Differential/Platelet   Estradiol   Progesterone   Follicle stimulating hormone   Luteinizing hormone     Other   Low iron   Relevant Orders    Fe+TIBC+Fer   TSH   CMP14+EGFR   Lipid panel   CBC with Differential/Platelet   Estradiol   Progesterone   Follicle stimulating hormone   Luteinizing hormone   Hormone replacement therapy (HRT)   Continue current regimen, gets her prescriptions through [facility].      Relevant  Orders   Fe+TIBC+Fer   TSH   CMP14+EGFR   Lipid panel   CBC with Differential/Platelet   Estradiol   Progesterone   Follicle stimulating hormone   Luteinizing hormone   GAD (generalized anxiety disorder)   Continue current dose of Lexapro that we could always go up by 5 mg if needed although can always reach out via MyChart otherwise follow-up in 6 months.      Other Visit Diagnoses       Wellness examination    -  Primary   Relevant Orders   Fe+TIBC+Fer   TSH   CMP14+EGFR   Lipid panel   CBC with Differential/Platelet   Estradiol   Progesterone   Follicle stimulating hormone   Luteinizing hormone     Low ferritin       Relevant Orders   Fe+TIBC+Fer   TSH   CMP14+EGFR   Lipid panel   CBC with Differential/Platelet   Estradiol   Progesterone   Follicle stimulating hormone   Luteinizing hormone       Keep up a regular exercise program and make sure you are eating a healthy diet Try to eat 4 servings of dairy a day, or if you are lactose intolerant take a calcium with vitamin D daily.  Your vaccines are up to date.  Will get up-to-date labs today.  Return in about 6 months (around 07/07/2024) for Mood, HRT.     [name], MD

## 2024-01-06 LAB — IRON,TIBC AND FERRITIN PANEL
Ferritin: 34 ng/mL (ref 15–150)
Iron Saturation: 17 % (ref 15–55)
Iron: 53 ug/dL (ref 27–159)
Total Iron Binding Capacity: 309 ug/dL (ref 250–450)
UIBC: 256 ug/dL (ref 131–425)

## 2024-01-06 LAB — TSH: TSH: 0.623 u[IU]/mL (ref 0.450–4.500)

## 2024-01-06 LAB — CMP14+EGFR
ALT: 24 IU/L (ref 0–32)
AST: 28 IU/L (ref 0–40)
Albumin: 4.3 g/dL (ref 3.8–4.9)
Alkaline Phosphatase: 81 IU/L (ref 44–121)
BUN/Creatinine Ratio: 19 (ref 9–23)
BUN: 17 mg/dL (ref 6–24)
Bilirubin Total: 0.3 mg/dL (ref 0.0–1.2)
CO2: 24 mmol/L (ref 20–29)
Calcium: 9.2 mg/dL (ref 8.7–10.2)
Chloride: 102 mmol/L (ref 96–106)
Creatinine, Ser: 0.88 mg/dL (ref 0.57–1.00)
Globulin, Total: 2.3 g/dL (ref 1.5–4.5)
Glucose: 74 mg/dL (ref 70–99)
Potassium: 4.6 mmol/L (ref 3.5–5.2)
Sodium: 139 mmol/L (ref 134–144)
Total Protein: 6.6 g/dL (ref 6.0–8.5)
eGFR: 78 mL/min/{1.73_m2} (ref >59)

## 2024-01-06 LAB — CBC WITH DIFFERENTIAL/PLATELET
Basophils Absolute: 0.1 10*3/uL (ref 0.0–0.2)
Basos: 1 %
EOS (ABSOLUTE): 0.1 10*3/uL (ref 0.0–0.4)
Eos: 2 %
Hematocrit: 38.2 % (ref 34.0–46.6)
Hemoglobin: 12.5 g/dL (ref 11.1–15.9)
Immature Grans (Abs): 0 10*3/uL (ref 0.0–0.1)
Immature Granulocytes: 0 %
Lymphocytes Absolute: 2.5 10*3/uL (ref 0.7–3.1)
Lymphs: 34 %
MCH: 30.3 pg (ref 26.6–33.0)
MCHC: 32.7 g/dL (ref 31.5–35.7)
MCV: 93 fL (ref 79–97)
Monocytes Absolute: 0.5 10*3/uL (ref 0.1–0.9)
Monocytes: 7 %
Neutrophils Absolute: 4.1 10*3/uL (ref 1.4–7.0)
Neutrophils: 56 %
Platelets: 221 10*3/uL (ref 150–450)
RBC: 4.13 x10E6/uL (ref 3.77–5.28)
RDW: 12.8 % (ref 11.7–15.4)
WBC: 7.3 10*3/uL (ref 3.4–10.8)

## 2024-01-06 LAB — LIPID PANEL
Chol/HDL Ratio: 3.2 ratio (ref 0.0–4.4)
Cholesterol, Total: 155 mg/dL (ref 100–199)
HDL: 49 mg/dL (ref >39)
LDL Chol Calc (NIH): 96 mg/dL (ref 0–99)
Triglycerides: 48 mg/dL (ref 0–149)
VLDL Cholesterol Cal: 10 mg/dL (ref 5–40)

## 2024-01-06 LAB — LUTEINIZING HORMONE: LH: 61.4 m[IU]/mL

## 2024-01-06 LAB — FOLLICLE STIMULATING HORMONE: FSH: 85.8 m[IU]/mL

## 2024-01-06 LAB — PROGESTERONE: Progesterone: 0.1 ng/mL

## 2024-01-06 LAB — ESTRADIOL: Estradiol: 12 pg/mL

## 2024-01-07 ENCOUNTER — Encounter: Payer: Self-pay | Admitting: Family Medicine

## 2024-01-07 NOTE — Progress Notes (Signed)
 Hi Amore total iron and ferritin look better please continue with supplemental iron had like to get your ferritin above 40.  Metabolic panel, cholesterol, and thyroid look great.  Normal blood count no anemia.  Estradiol is lower than it has been in previous years but you are just doing the vaginal cream weekly I believe so that makes sense.  Your FSH and LH are in the postmenopausal range.  Progesterone is on the lower end.  But as long as you are sleeping and resting well we can keep it where it sat.  But if we needed to adjust the number of clicks on your progesterone if you are having any significant sleep disruption we can.  Again I know your goal is to work towards sort of weaning off of these medications over time so again if you are doing really well with 1-Click then lets just stick with that.

## 2024-04-06 ENCOUNTER — Encounter: Payer: Self-pay | Admitting: Family Medicine

## 2024-04-06 MED ORDER — AMBULATORY NON FORMULARY MEDICATION: 6 refills | Status: AC

## 2024-04-06 NOTE — Telephone Encounter (Signed)
 Requesting rx rf of progesterone   Last written 04/03/2023 Last OV 01/05/2024 Upcoming appt 07/07/2024  Prescription has fax#  Fax: [phone]

## 2024-04-06 NOTE — Telephone Encounter (Signed)
 Faxed prescription to [facility] at fax # [phone]

## 2024-05-03 ENCOUNTER — Other Ambulatory Visit: Payer: Self-pay | Admitting: Family Medicine

## 2024-05-03 DIAGNOSIS — Z1231 Encounter for screening mammogram for malignant neoplasm of breast: Secondary | ICD-10-CM

## 2024-06-15 ENCOUNTER — Encounter: Payer: Self-pay | Admitting: Sports Medicine

## 2024-07-07 ENCOUNTER — Ambulatory Visit (INDEPENDENT_AMBULATORY_CARE_PROVIDER_SITE_OTHER): Admitting: Family Medicine

## 2024-07-07 ENCOUNTER — Telehealth: Payer: Self-pay | Admitting: Family Medicine

## 2024-07-07 ENCOUNTER — Encounter: Payer: Self-pay | Admitting: Family Medicine

## 2024-07-07 VITALS — BP 104/67 | HR 77 | Ht 63.0 in | Wt 134.0 lb

## 2024-07-07 DIAGNOSIS — F411 Generalized anxiety disorder: Secondary | ICD-10-CM

## 2024-07-07 DIAGNOSIS — H9190 Unspecified hearing loss, unspecified ear: Secondary | ICD-10-CM | POA: Diagnosis not present

## 2024-07-07 MED ORDER — ESCITALOPRAM OXALATE 10 MG PO TABS: 10.0000 mg | ORAL_TABLET | Freq: Every day | ORAL | 1 refills | Status: AC

## 2024-07-07 NOTE — Telephone Encounter (Signed)
 Patient is scheduled for 6 month for Mood and HTN in march she stated that her physical is due can that be done all together? Please advise

## 2024-07-07 NOTE — Assessment & Plan Note (Signed)
 Passed hearing screen today at 40 db.  Will monitor.

## 2024-07-07 NOTE — Assessment & Plan Note (Signed)
    07/07/2024   10:06 AM 07/01/2023   10:52 AM 02/24/2020    9:01 AM 03/17/2019    8:36 AM  GAD 7 : Generalized Anxiety Score  Nervous, Anxious, on Edge 0 1 1 0  Control/stop worrying 0 0 0 0  Worry too much - different things 0 0 0 0  Trouble relaxing 0 0 0 0  Restless 0 0 0 0  Easily annoyed or irritable 0 1 1 0  Afraid - awful might happen 0 0 0 0  Total GAD 7 Score 0 2 2 0  Anxiety Difficulty  Not difficult at all Not difficult at all Not difficult at all   GAD score looks good.  Continue current regimen of Lexapro

## 2024-07-07 NOTE — Telephone Encounter (Signed)
 Switched the visit. Left message advising.

## 2024-07-07 NOTE — Progress Notes (Signed)
 Established Patient Office Visit  Subjective  Patient ID: Joann Wilson, female    DOB: August 05, 1971  Age: 53 y.o. MRN: [id number]  Chief Complaint  Patient presents with   Medical Management of Chronic Issues    HPI   Discussed the use of AI scribe software for clinical note transcription with the patient, who gave verbal consent to proceed.  History of Present Illness Joann Wilson is a 53 year old female who presents for a discussion on vaccinations and hormone management.  Immunization considerations - Considering influenza vaccination; has delayed until this visit and plans to schedule within the next week - Contemplating COVID-19 vaccination due to new policies and history as a former smoker - History of mild COVID-19 infection on multiple occasions - Interested in receiving the Pfizer COVID-19 vaccine, which she has previously received - Considering Prevnar 20 vaccination - Previously received shingles vaccine, which she found to be strong  Menopausal symptoms - Experiencing menopausal symptoms, including hot flashes that are intermittent - Currently using hormone creams for symptom management - Hormone testing indicates postmenopausal levels - Continues to have sporadic menstrual periods; last period occurred 74 days ago - Requires a fan at night for sleep due to hot flashes  Respiratory health - History of bronchitis - Concerned about increased risk of respiratory complications if reinfected with COVID-19  Hearing loss - Noticing hearing difficulties, particularly in one ear - Previous [employer] hearing test indicated some hearing loss - Interested in formal hearing screening - Husband has encouraged hearing evaluation     ROS    Objective:     BP 104/67   Pulse 77   Ht 5' 3 (1.6 m)   Wt 134 lb (60.8 kg)   SpO2 100%   BMI 23.74 kg/m    Physical Exam Vitals and nursing note reviewed.  Constitutional:      Appearance: Normal appearance.  HENT:      Head: Normocephalic and atraumatic.  Eyes:     Conjunctiva/sclera: Conjunctivae normal.  Cardiovascular:     Rate and Rhythm: Normal rate and regular rhythm.  Pulmonary:     Effort: Pulmonary effort is normal.     Breath sounds: Normal breath sounds.  Skin:    General: Skin is warm and dry.  Neurological:     Mental Status: She is alert.  Psychiatric:        Mood and Affect: Mood normal.      No results found for any visits on 07/07/24.    The 10-year ASCVD risk score (Arnett DK, et al., 2019) is: 1%    Assessment & Plan:   Problem List Items Addressed This Visit       Other   Subjective hearing change - Primary   Passed hearing screen today at 40 db.  Will monitor.        GAD (generalized anxiety disorder)      07/07/2024   10:06 AM 07/01/2023   10:52 AM 02/24/2020    9:01 AM 03/17/2019    8:36 AM  GAD 7 : Generalized Anxiety Score  Nervous, Anxious, on Edge 0 1 1 0  Control/stop worrying 0 0 0 0  Worry too much - different things 0 0 0 0  Trouble relaxing 0 0 0 0  Restless 0 0 0 0  Easily annoyed or irritable 0 1 1 0  Afraid - awful might happen 0 0 0 0  Total GAD 7 Score 0 2 2 0  Anxiety Difficulty  Not difficult at all Not difficult at all Not difficult at all   GAD score looks good.  Continue current regimen of Lexapro        Relevant Medications   escitalopram  (LEXAPRO ) 10 MG tablet   Assessment and Plan Assessment & Plan COVID-19 vaccination counseling She is eligible for the COVID-19 vaccine. Discussed benefits of vaccination, including potential for milder symptoms upon reinfection. She prefers vaccination d - Administer Pfizer COVID-19 vaccine at the clinic if she chooses. - Discussed option to receive the vaccine at [facility] - plans on getting fluc vaccine and Prevnar 20 as well     Return in about 6 months (around 01/04/2025) for HRT/MOOD.    Dorothyann Byars, MD

## 2024-07-07 NOTE — Telephone Encounter (Signed)
 Ok to schedule as a CPE

## 2024-07-12 ENCOUNTER — Other Ambulatory Visit: Payer: Self-pay | Admitting: Family Medicine

## 2024-08-16 ENCOUNTER — Encounter: Payer: Self-pay | Admitting: Family Medicine

## 2024-08-16 MED ORDER — AMBULATORY NON FORMULARY MEDICATION: 0 refills | Status: DC

## 2024-08-26 ENCOUNTER — Ambulatory Visit
Admission: RE | Admit: 2024-08-26 | Discharge: 2024-08-26 | Disposition: A | Source: Ambulatory Visit | Attending: Family Medicine | Admitting: Family Medicine

## 2024-08-26 DIAGNOSIS — Z1231 Encounter for screening mammogram for malignant neoplasm of breast: Secondary | ICD-10-CM

## 2024-08-30 ENCOUNTER — Ambulatory Visit: Payer: Self-pay | Admitting: Family Medicine

## 2024-08-30 NOTE — Progress Notes (Signed)
 Please call patient. Normal mammogram.  Repeat in 1 year.

## 2024-09-05 ENCOUNTER — Other Ambulatory Visit: Payer: Self-pay | Admitting: Family Medicine

## 2024-11-04 ENCOUNTER — Encounter: Payer: Self-pay | Admitting: Family Medicine

## 2024-11-04 MED ORDER — AMBULATORY NON FORMULARY MEDICATION: 0 refills | Status: AC

## 2024-11-04 NOTE — Telephone Encounter (Signed)
 Requesting rx rf of [facility]otsterone [facility] Last written 08/16/2024 as 45ml No testosterone  lab showing in current lab  tab Last OV 07/07/2024 Upcoming appt 03/25/206

## 2025-01-05 ENCOUNTER — Encounter: Admitting: Family Medicine
